# Patient Record
Sex: Male | Born: 1980 | Race: Black or African American | Hispanic: No | Marital: Single | Smoking: Never smoker
Health system: Southern US, Community
[De-identification: ages and names within clinical notes are randomized; demographics above are authoritative.]

## PROBLEM LIST (undated history)

## (undated) DIAGNOSIS — I514 Myocarditis, unspecified: Secondary | ICD-10-CM

## (undated) DIAGNOSIS — I252 Old myocardial infarction: Secondary | ICD-10-CM

## (undated) HISTORY — DX: Old myocardial infarction: I25.2

## (undated) HISTORY — DX: Myocarditis, unspecified: I51.4

---

## 2002-08-01 ENCOUNTER — Emergency Department (HOSPITAL_COMMUNITY): Admission: EM | Admit: 2002-08-01 | Discharge: 2002-08-02 | Payer: Self-pay | Admitting: Emergency Medicine

## 2003-10-25 ENCOUNTER — Emergency Department (HOSPITAL_COMMUNITY): Admission: EM | Admit: 2003-10-25 | Discharge: 2003-10-25 | Payer: Self-pay | Admitting: Emergency Medicine

## 2004-02-14 ENCOUNTER — Ambulatory Visit (HOSPITAL_COMMUNITY): Admission: RE | Admit: 2004-02-14 | Discharge: 2004-02-14 | Payer: Self-pay | Admitting: Family Medicine

## 2006-07-10 ENCOUNTER — Emergency Department (HOSPITAL_COMMUNITY): Admission: EM | Admit: 2006-07-10 | Discharge: 2006-07-10 | Payer: Self-pay | Admitting: Emergency Medicine

## 2007-09-02 ENCOUNTER — Ambulatory Visit (HOSPITAL_COMMUNITY): Admission: RE | Admit: 2007-09-02 | Discharge: 2007-09-02 | Payer: Self-pay | Admitting: Family Medicine

## 2007-09-02 ENCOUNTER — Encounter: Payer: Self-pay | Admitting: Orthopedic Surgery

## 2007-09-03 ENCOUNTER — Ambulatory Visit: Payer: Self-pay | Admitting: Orthopedic Surgery

## 2007-09-03 ENCOUNTER — Encounter (INDEPENDENT_AMBULATORY_CARE_PROVIDER_SITE_OTHER): Payer: Self-pay | Admitting: *Deleted

## 2007-09-03 DIAGNOSIS — S4360XA Sprain of unspecified sternoclavicular joint, initial encounter: Secondary | ICD-10-CM | POA: Insufficient documentation

## 2007-10-13 ENCOUNTER — Telehealth: Payer: Self-pay | Admitting: Orthopedic Surgery

## 2008-05-28 ENCOUNTER — Emergency Department (HOSPITAL_COMMUNITY): Admission: EM | Admit: 2008-05-28 | Discharge: 2008-05-29 | Payer: Self-pay | Admitting: Emergency Medicine

## 2010-04-09 ENCOUNTER — Emergency Department (HOSPITAL_COMMUNITY): Admission: EM | Admit: 2010-04-09 | Discharge: 2010-04-09 | Payer: Self-pay | Admitting: Emergency Medicine

## 2012-09-21 ENCOUNTER — Encounter (HOSPITAL_COMMUNITY): Payer: Self-pay | Admitting: *Deleted

## 2012-09-21 ENCOUNTER — Emergency Department (HOSPITAL_COMMUNITY)
Admission: EM | Admit: 2012-09-21 | Discharge: 2012-09-22 | Disposition: A | Discharge status: Home / Self Care | Payer: MEDICAID | Attending: Emergency Medicine | Admitting: Emergency Medicine

## 2012-09-21 DIAGNOSIS — R509 Fever, unspecified: Secondary | ICD-10-CM | POA: Insufficient documentation

## 2012-09-21 DIAGNOSIS — R51 Headache: Secondary | ICD-10-CM

## 2012-09-21 DIAGNOSIS — M25569 Pain in unspecified knee: Secondary | ICD-10-CM | POA: Insufficient documentation

## 2012-09-21 DIAGNOSIS — R21 Rash and other nonspecific skin eruption: Secondary | ICD-10-CM

## 2012-09-21 LAB — CBC WITH DIFFERENTIAL/PLATELET
Basophils Absolute: 0 10*3/uL (ref 0.0–0.1)
Eosinophils Relative: 0 % (ref 0–5)
HCT: 36.2 % — ABNORMAL LOW (ref 39.0–52.0)
Hemoglobin: 13 g/dL (ref 13.0–17.0)
Lymphocytes Relative: 17 % (ref 12–46)
MCV: 84.8 fL (ref 78.0–100.0)
Monocytes Relative: 8 % (ref 3–12)
Neutrophils Relative %: 75 % (ref 43–77)
Platelets: 152 10*3/uL (ref 150–400)
WBC: 9.4 10*3/uL (ref 4.0–10.5)

## 2012-09-21 MED ORDER — DEXAMETHASONE SODIUM PHOSPHATE 10 MG/ML IJ SOLN
10.0000 mg | Freq: Once | INTRAMUSCULAR | Status: AC
  Administered 2012-09-22: 10 mg via INTRAVENOUS
  Filled 2012-09-21: qty 1

## 2012-09-21 MED ORDER — METOCLOPRAMIDE HCL 5 MG/ML IJ SOLN
10.0000 mg | Freq: Once | INTRAMUSCULAR | Status: AC
  Administered 2012-09-22: 10 mg via INTRAVENOUS
  Filled 2012-09-21: qty 2

## 2012-09-21 MED ORDER — SODIUM CHLORIDE 0.9 % IV BOLUS (SEPSIS)
1000.0000 mL | Freq: Once | INTRAVENOUS | Status: AC
  Administered 2012-09-22: 1000 mL via INTRAVENOUS

## 2012-09-21 MED ORDER — DIPHENHYDRAMINE HCL 50 MG/ML IJ SOLN
25.0000 mg | Freq: Once | INTRAMUSCULAR | Status: AC
  Administered 2012-09-22: 25 mg via INTRAVENOUS
  Filled 2012-09-21: qty 1

## 2012-09-21 NOTE — ED Notes (Signed)
Pt c/o fever since Wednesday, headaches, and body aches.

## 2012-09-21 NOTE — ED Provider Notes (Signed)
History     CSN: [id number]  Arrival date & time 09/21/12  2148   First MD Initiated Contact with Patient 09/21/12 2214      Chief Complaint  Patient presents with  . Muscle Pain    (Consider location/radiation/quality/duration/timing/severity/associated sxs/prior treatment) HPI History provided by pt.   Pt has had fever and intermittent frontal headache for the past 6 days.  Max temp 102.6.  Associated w/ bilateral upper arm as well as bilateral posterior knee pain that is alleviated only by elevation and walking; unable to stand to urinate.  No associated sore throat, cough, N/V/D.  Has had decreased urgency to urinate, but otherwise no urinary sx.  No known sick contacts.  Evaluated by PCP this morning and she noticed a rash to lower legs.  Pt denies pain/pruritis.  She obtained a CBC and strep which was negative, and referred to ER for further evaluation.  Pt is not aware of any tick bites.   History reviewed. No pertinent past medical history.  History reviewed. No pertinent past surgical history.  History reviewed. No pertinent family history.  History  Substance Use Topics  . Smoking status: Never Smoker   . Smokeless tobacco: Not on file  . Alcohol Use: No      Review of Systems  All other systems reviewed and are negative.    Allergies  Review of patient's allergies indicates no known allergies.  Home Medications   Current Outpatient Rx  Name  Route  Sig  Dispense  Refill  . ACETAMINOPHEN 500 MG PO TABS   Oral   Take 1,000 mg by mouth every 6 (six) hours as needed. For pain.         . IBUPROFEN 200 MG PO TABS   Oral   Take 800 mg by mouth every 6 (six) hours as needed. For fever and pain.           BP 122/66  Pulse 90  Temp 99.9 F (37.7 C)  Resp 16  SpO2 98%  Physical Exam  Nursing note and vitals reviewed. Constitutional: He is oriented to person, place, and time. He appears well-developed and well-nourished. No distress.  HENT:  Head:  Normocephalic and atraumatic.       Erythema of soft palate.  No tonsillar edema or exudate  Eyes:       Normal appearance  Neck: Normal range of motion.       No meningeal signs  Cardiovascular: Normal rate, regular rhythm and intact distal pulses.   Pulmonary/Chest: Effort normal and breath sounds normal. No respiratory distress.  Abdominal: Soft. Bowel sounds are normal. He exhibits no distension. There is no tenderness.  Musculoskeletal: Normal range of motion.  Lymphadenopathy:    He has no cervical adenopathy.  Neurological: He is alert and oriented to person, place, and time. No sensory deficit. Coordination normal.       CN 3-12 intact.  No nystagmus. 5/5 and equal upper and lower extremity strength.  No past pointing.     Skin: Skin is warm and dry. No rash noted.       Blanching, macular rash bilateral anterior lower legs from knee to ankle.  Non-tender.  No rash of palms/soles.   Psychiatric: He has a normal mood and affect. His behavior is normal.    ED Course  Procedures (including critical care time)  Labs Reviewed - No data to display No results found.   1. Headache   2. Rash  MDM  31yo M presents w/ 5 days headache, fever, myalgias, rash.  On exam, pt well-appearing, afebrile, no focal neuro deficits or meningeal signs, erythema of soft palate, no tonsillar edema/exudate or cervical adenopathy, nml breath sounds, abd benign, macular rash LEs sparing soles.  Meningitis unlikely based on exam; Dr. Rubin Payor has examined and is in agreement.  UA, CBC, CMP, sed rate and RMSF pending.  11:07 PM     Pt received IV NS, reglan, decadron and benadryl and reports that his headache has resolved.  Labs unremarkable w/ exception of RMSF which is pending,.  Results discussed w/ pt.  D/c'd home.  Recommended rest, fluids and f/u with his PCP.  Return precautions discussed. 1:06 AM         Otilio Miu, PA 09/22/12 [phone]

## 2012-09-21 NOTE — ED Notes (Signed)
Sent by [facility], concern for tick bite?

## 2012-09-22 LAB — URINALYSIS, ROUTINE W REFLEX MICROSCOPIC
Ketones, ur: 40 mg/dL — AB
pH: 6.5 (ref 5.0–8.0)

## 2012-09-22 LAB — COMPREHENSIVE METABOLIC PANEL
Alkaline Phosphatase: 86 U/L (ref 39–117)
BUN: 6 mg/dL (ref 6–23)
CO2: 29 mEq/L (ref 19–32)
Calcium: 8.8 mg/dL (ref 8.4–10.5)
Chloride: 99 mEq/L (ref 96–112)
Creatinine, Ser: 1.06 mg/dL (ref 0.50–1.35)
Glucose, Bld: 121 mg/dL — ABNORMAL HIGH (ref 70–99)
Potassium: 3.9 mEq/L (ref 3.5–5.1)
Total Protein: 7.2 g/dL (ref 6.0–8.3)

## 2012-09-22 MED ORDER — HYDROCODONE-ACETAMINOPHEN 5-325 MG PO TABS: 1.0000 | ORAL_TABLET | ORAL | Status: DC | PRN

## 2012-09-22 MED ORDER — IBUPROFEN 800 MG PO TABS: 800.0000 mg | ORAL_TABLET | Freq: Three times a day (TID) | ORAL | Status: DC

## 2012-09-23 NOTE — ED Provider Notes (Signed)
Medical screening examination/treatment/procedure(s) were performed by non-physician practitioner and as supervising physician I was immediately available for consultation/collaboration.  Dezirae Service R. Blakeleigh Domek, MD 09/23/12 0356 

## 2012-09-25 LAB — ROCKY MTN SPOTTED FVR AB, IGG-BLOOD: RMSF IgG: 0.27 IV

## 2021-07-05 ENCOUNTER — Inpatient Hospital Stay (HOSPITAL_BASED_OUTPATIENT_CLINIC_OR_DEPARTMENT_OTHER)
Admission: EM | Admit: 2021-07-05 | Discharge: 2021-07-08 | DRG: 281 | Disposition: A | Discharge status: Home / Self Care | Payer: 59 | Attending: Internal Medicine | Admitting: Internal Medicine

## 2021-07-05 ENCOUNTER — Encounter (HOSPITAL_BASED_OUTPATIENT_CLINIC_OR_DEPARTMENT_OTHER): Payer: Self-pay | Admitting: Emergency Medicine

## 2021-07-05 ENCOUNTER — Emergency Department (HOSPITAL_BASED_OUTPATIENT_CLINIC_OR_DEPARTMENT_OTHER): Payer: 59 | Admitting: Radiology

## 2021-07-05 ENCOUNTER — Other Ambulatory Visit: Payer: Self-pay

## 2021-07-05 DIAGNOSIS — Z20822 Contact with and (suspected) exposure to covid-19: Secondary | ICD-10-CM | POA: Diagnosis present

## 2021-07-05 DIAGNOSIS — R21 Rash and other nonspecific skin eruption: Secondary | ICD-10-CM | POA: Diagnosis not present

## 2021-07-05 DIAGNOSIS — I400 Infective myocarditis: Principal | ICD-10-CM | POA: Diagnosis present

## 2021-07-05 DIAGNOSIS — R7401 Elevation of levels of liver transaminase levels: Secondary | ICD-10-CM

## 2021-07-05 DIAGNOSIS — R509 Fever, unspecified: Secondary | ICD-10-CM

## 2021-07-05 DIAGNOSIS — B09 Unspecified viral infection characterized by skin and mucous membrane lesions: Secondary | ICD-10-CM

## 2021-07-05 DIAGNOSIS — B3322 Viral myocarditis: Secondary | ICD-10-CM

## 2021-07-05 DIAGNOSIS — M60004 Infective myositis, unspecified left leg: Secondary | ICD-10-CM | POA: Diagnosis present

## 2021-07-05 DIAGNOSIS — I214 Non-ST elevation (NSTEMI) myocardial infarction: Secondary | ICD-10-CM | POA: Diagnosis present

## 2021-07-05 DIAGNOSIS — R519 Headache, unspecified: Secondary | ICD-10-CM | POA: Diagnosis present

## 2021-07-05 DIAGNOSIS — M60005 Infective myositis, unspecified leg: Secondary | ICD-10-CM

## 2021-07-05 DIAGNOSIS — I409 Acute myocarditis, unspecified: Secondary | ICD-10-CM | POA: Diagnosis present

## 2021-07-05 LAB — TROPONIN I (HIGH SENSITIVITY): Troponin I (High Sensitivity): <2 ng/L (ref <18)

## 2021-07-05 LAB — BASIC METABOLIC PANEL
Anion gap: 10 (ref 5–15)
BUN: 8 mg/dL (ref 6–20)
CO2: 28 mmol/L (ref 22–32)
Calcium: 8.9 mg/dL (ref 8.9–10.3)
Chloride: 97 mmol/L — ABNORMAL LOW (ref 98–111)
Creatinine, Ser: 1 mg/dL (ref 0.61–1.24)
GFR, Estimated: >60 mL/min (ref >60)
Glucose, Bld: 118 mg/dL — ABNORMAL HIGH (ref 70–99)
Potassium: 3.7 mmol/L (ref 3.5–5.1)
Sodium: 135 mmol/L (ref 135–145)

## 2021-07-05 LAB — CBC
HCT: 39.2 % (ref 39.0–52.0)
Hemoglobin: 13.6 g/dL (ref 13.0–17.0)
MCH: 30.7 pg (ref 26.0–34.0)
MCHC: 34.7 g/dL (ref 30.0–36.0)
MCV: 88.5 fL (ref 80.0–100.0)
Platelets: 217 10*3/uL (ref 150–400)
RBC: 4.43 MIL/uL (ref 4.22–5.81)
RDW: 13.3 % (ref 11.5–15.5)
WBC: 12.1 10*3/uL — ABNORMAL HIGH (ref 4.0–10.5)
nRBC: 0 % (ref 0.0–0.2)

## 2021-07-05 MED ORDER — IBUPROFEN 400 MG PO TABS
600.0000 mg | ORAL_TABLET | Freq: Once | ORAL | Status: AC
  Administered 2021-07-05: 600 mg via ORAL
  Filled 2021-07-05: qty 1

## 2021-07-05 MED ORDER — DOXYCYCLINE HYCLATE 100 MG PO CAPS: 100.0000 mg | ORAL_CAPSULE | Freq: Two times a day (BID) | ORAL | 0 refills | Status: DC

## 2021-07-05 NOTE — ED Triage Notes (Addendum)
Pt arrives pov with c/o fever, generalized body aches and rash, red circular on body and bilaterally on arms and legs x 5 days. 400 mg advil 3 hrs pta. Pt also c/o chest tightness

## 2021-07-05 NOTE — ED Provider Notes (Addendum)
[facility] EMERGENCY DEPT Provider Note   CSN: [id number] Arrival date & time: 07/05/21  1837     History Chief Complaint  Patient presents with   Fever    Juan Bowers is a 40 y.o. male.  Patient is a 40 year old male with no significant past medical history.  Patient presenting with complaints of fever and rash.  He reports a 5-day history of body aches, cold chills, sweating episodes.  He has been running a fever at home to 101.  He was seen at urgent care yesterday and had a COVID test which was negative.  He has noticed a rash to his torso, arms, and legs.  He has seen small, red, raised areas in these areas.  The history is provided by the patient.  Fever Severity:  Moderate Onset quality:  Sudden Duration:  5 days Timing:  Intermittent Progression:  Worsening Chronicity:  New Relieved by:  Nothing Worsened by:  Nothing Ineffective treatments:  None tried     History reviewed. No pertinent past medical history.  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 No pertinent surgical history.     History reviewed. No pertinent family history.  Social History   Tobacco Use   Smoking status: Never  Substance Use Topics   Alcohol use: Not Currently   Drug use: No    Home Medications Prior to Admission medications   Medication Sig Start Date End Date Taking? Authorizing Provider  acetaminophen (TYLENOL) 500 MG tablet Take 1,000 mg by mouth every 6 (six) hours as needed. For pain.    [provider]  HYDROcodone-acetaminophen (NORCO/VICODIN) 5-325 MG per tablet Take 1 tablet by mouth every 4 (four) hours as needed for pain. 09/22/12   Schinlever, Santina Evans, PA-C  ibuprofen (ADVIL,MOTRIN) 200 MG tablet Take 800 mg by mouth every 6 (six) hours as needed. For fever and pain.    [provider]  ibuprofen (ADVIL,MOTRIN) 800 MG tablet Take 1 tablet (800 mg  total) by mouth 3 (three) times daily. 09/22/12   Schinlever, Santina Evans, PA-C    Allergies    Patient has no known allergies.  Review of Systems   Review of Systems  Constitutional:  Positive for fever.  All other systems reviewed and are negative.  Physical Exam Updated Vital Signs BP 125/82 (BP Location: Right Arm)   Pulse 86   Temp 99.9 F (37.7 C) (Oral)   Resp 20   Ht 6\' 1"  (1.854 m)   Wt 91.2 kg   SpO2 100%   BMI 26.52 kg/m   Physical Exam Vitals and nursing note reviewed.  Constitutional:      General: He is not in acute distress.    Appearance: He is well-developed. He is not diaphoretic.  HENT:     Head: Normocephalic and atraumatic.  Cardiovascular:     Rate and Rhythm: Normal rate and regular rhythm.     Heart sounds: No murmur heard.   No friction rub.  Pulmonary:     Effort: Pulmonary effort is normal. No respiratory distress.     Breath sounds: Normal breath sounds. No wheezing or rales.  Abdominal:     General: Bowel sounds are normal. There is no distension.     Palpations: Abdomen is soft.     Tenderness: There is no abdominal tenderness.  Musculoskeletal:        General: Normal range of motion.  Cervical back: Normal range of motion and neck supple.  Skin:    General: Skin is warm and dry.     Findings: Rash present.     Comments: There are multiple, round, blanching, erythematous lesions noted to the upper and lower extremities and torso.  Neurological:     Mental Status: He is alert and oriented to person, place, and time.     Coordination: Coordination normal.    ED Results / Procedures / Treatments   Labs (all labs ordered are listed, but only abnormal results are displayed) Labs Reviewed  BASIC METABOLIC PANEL - Abnormal; Notable for the following components:      Result Value   Chloride 97 (*)    Glucose, Bld 118 (*)    All other components within normal limits  CBC - Abnormal; Notable for the following components:   WBC 12.1  (*)    All other components within normal limits  TROPONIN I (HIGH SENSITIVITY)  TROPONIN I (HIGH SENSITIVITY)    EKG None  Radiology DG Chest 2 View  Result Date: 07/05/2021 CLINICAL DATA:  Fever, chest tightness EXAM: CHEST - 2 VIEW COMPARISON:  None. FINDINGS: The heart size and mediastinal contours are within normal limits. Both lungs are clear. The visualized skeletal structures are unremarkable. IMPRESSION: No active cardiopulmonary disease. Electronically Signed   By: Charlett Nose M.D.   On: 07/05/2021 20:21    Procedures Procedures   Medications Ordered in ED Medications - No data to display  ED Course  I have reviewed the triage vital signs and the nursing notes.  Pertinent labs & imaging results that were available during my care of the patient were reviewed by me and considered in my medical decision making (see chart for details).    MDM Rules/Calculators/A&P  Patient presenting here with complaints of fever and rash.  I suspect a viral etiology, but patient not improving with conservative care.  He denies a history of tick bite, but I feel as though RMSF is on the differential.  Patient to be started on doxycycline if symptoms are not improving in the next 24 hours.  The rash is not petechial and inconsistent with monkey pox.  Patient initially to be discharged, however when the nurse returned to his room to go over his discharge instructions, patient was complaining of chest pain radiating into his left arm.  Patient was reassessed and EKG was repeated.  He had nonspecific T wave abnormalities in the inferior leads, unchanged from his initial EKG upon presentation.  Patient appeared anxious and was complaining of tingling in his hands, so was given an injection of Ativan.  A second troponin was obtained, unfortunately revealing level of 12,000.  Patient then started on heparin and aspirin and cardiology was consulted.  I spoke with Dr. Patsi Sears who agrees to accept  the patient in transfer.  This is somewhat confusing clinical picture has patient initially presented with an acute febrile illness which may or may not be related to his acute coronary syndrome/non-STEMI which was found at the end of his visit just before he was discharged.  Whether he has some sort of myocarditis or febrile illness with unrelated non-STEMI remains to be seen.  CRITICAL CARE Performed by: Geoffery Lyons Total critical care time: 45 minutes Critical care time was exclusive of separately billable procedures and treating other patients. Critical care was necessary to treat or prevent imminent or life-threatening deterioration. Critical care was time spent personally by me on the following activities:  development of treatment plan with patient and/or surrogate as well as nursing, discussions with consultants, evaluation of patient's response to treatment, examination of patient, obtaining history from patient or surrogate, ordering and performing treatments and interventions, ordering and review of laboratory studies, ordering and review of radiographic studies, pulse oximetry and re-evaluation of patient's condition.   Final Clinical Impression(s) / ED Diagnoses Final diagnoses:  None    Rx / DC Orders ED Discharge Orders     None        Geoffery Lyons, MD 07/05/21 4268    Geoffery Lyons, MD 07/05/21 3419    Geoffery Lyons, MD 07/06/21 6222    Geoffery Lyons, MD 07/06/21 [phone]

## 2021-07-05 NOTE — Discharge Instructions (Addendum)
Take Tylenol 1000 mg rotated with ibuprofen 600 mg every 4 hours as needed for fever.  Drink plenty of fluids and get plenty of rest.  If symptoms are not improving in the next 1 to 2 days, fill the prescription for doxycycline you have been given this evening.

## 2021-07-06 ENCOUNTER — Inpatient Hospital Stay (HOSPITAL_COMMUNITY): Payer: 59

## 2021-07-06 ENCOUNTER — Other Ambulatory Visit: Payer: Self-pay

## 2021-07-06 DIAGNOSIS — R21 Rash and other nonspecific skin eruption: Secondary | ICD-10-CM | POA: Diagnosis present

## 2021-07-06 DIAGNOSIS — I409 Acute myocarditis, unspecified: Secondary | ICD-10-CM | POA: Diagnosis present

## 2021-07-06 DIAGNOSIS — M60004 Infective myositis, unspecified left leg: Secondary | ICD-10-CM | POA: Diagnosis present

## 2021-07-06 DIAGNOSIS — R519 Headache, unspecified: Secondary | ICD-10-CM | POA: Diagnosis present

## 2021-07-06 DIAGNOSIS — Z20822 Contact with and (suspected) exposure to covid-19: Secondary | ICD-10-CM | POA: Diagnosis present

## 2021-07-06 DIAGNOSIS — I214 Non-ST elevation (NSTEMI) myocardial infarction: Secondary | ICD-10-CM | POA: Diagnosis present

## 2021-07-06 DIAGNOSIS — R079 Chest pain, unspecified: Secondary | ICD-10-CM | POA: Diagnosis not present

## 2021-07-06 DIAGNOSIS — I400 Infective myocarditis: Principal | ICD-10-CM

## 2021-07-06 DIAGNOSIS — B09 Unspecified viral infection characterized by skin and mucous membrane lesions: Secondary | ICD-10-CM | POA: Diagnosis present

## 2021-07-06 LAB — BASIC METABOLIC PANEL
Anion gap: 12 (ref 5–15)
BUN: 6 mg/dL (ref 6–20)
CO2: 25 mmol/L (ref 22–32)
Calcium: 9 mg/dL (ref 8.9–10.3)
Chloride: 99 mmol/L (ref 98–111)
Creatinine, Ser: 0.99 mg/dL (ref 0.61–1.24)
GFR, Estimated: >60 mL/min (ref >60)
Glucose, Bld: 118 mg/dL — ABNORMAL HIGH (ref 70–99)
Potassium: 3.6 mmol/L (ref 3.5–5.1)
Sodium: 136 mmol/L (ref 135–145)

## 2021-07-06 LAB — HEPATIC FUNCTION PANEL
ALT: 139 U/L — ABNORMAL HIGH (ref 0–44)
AST: 76 U/L — ABNORMAL HIGH (ref 15–41)
Albumin: 3.4 g/dL — ABNORMAL LOW (ref 3.5–5.0)
Alkaline Phosphatase: 102 U/L (ref 38–126)
Bilirubin, Direct: 0.3 mg/dL — ABNORMAL HIGH (ref 0.0–0.2)
Indirect Bilirubin: 1 mg/dL — ABNORMAL HIGH (ref 0.3–0.9)
Total Bilirubin: 1.3 mg/dL — ABNORMAL HIGH (ref 0.3–1.2)
Total Protein: 7 g/dL (ref 6.5–8.1)

## 2021-07-06 LAB — CBC
HCT: 36.7 % — ABNORMAL LOW (ref 39.0–52.0)
Hemoglobin: 13.1 g/dL (ref 13.0–17.0)
MCH: 31.2 pg (ref 26.0–34.0)
MCHC: 35.7 g/dL (ref 30.0–36.0)
MCV: 87.4 fL (ref 80.0–100.0)
Platelets: 218 10*3/uL (ref 150–400)
RBC: 4.2 MIL/uL — ABNORMAL LOW (ref 4.22–5.81)
RDW: 13.3 % (ref 11.5–15.5)
WBC: 10.8 10*3/uL — ABNORMAL HIGH (ref 4.0–10.5)
nRBC: 0 % (ref 0.0–0.2)

## 2021-07-06 LAB — RESP PANEL BY RT-PCR (FLU A&B, COVID) ARPGX2
Influenza A by PCR: NEGATIVE
Influenza B by PCR: NEGATIVE
SARS Coronavirus 2 by RT PCR: NEGATIVE

## 2021-07-06 LAB — HEPATITIS PANEL, ACUTE
HCV Ab: NONREACTIVE
Hep A IgM: NONREACTIVE
Hep B C IgM: NONREACTIVE
Hepatitis B Surface Ag: NONREACTIVE

## 2021-07-06 LAB — ECHOCARDIOGRAM COMPLETE
AR max vel: 4.35 cm2
AV Area VTI: 4.37 cm2
AV Area mean vel: 3.65 cm2
AV Mean grad: 3 mmHg
AV Peak grad: 3.8 mmHg
Ao pk vel: 0.97 m/s
Area-P 1/2: 4.89 cm2
Height: 73 in
S' Lateral: 3 cm
Single Plane A4C EF: 68.6 %
Weight: 3259.2 oz

## 2021-07-06 LAB — LIPID PANEL
Cholesterol: 170 mg/dL (ref 0–200)
HDL: 34 mg/dL — ABNORMAL LOW (ref >40)
LDL Cholesterol: 120 mg/dL — ABNORMAL HIGH (ref 0–99)
Total CHOL/HDL Ratio: 5 RATIO
Triglycerides: 78 mg/dL (ref <150)
VLDL: 16 mg/dL (ref 0–40)

## 2021-07-06 LAB — HEPARIN LEVEL (UNFRACTIONATED)
Heparin Unfractionated: 0.16 IU/mL — ABNORMAL LOW (ref 0.30–0.70)
Heparin Unfractionated: 0.18 IU/mL — ABNORMAL LOW (ref 0.30–0.70)
Heparin Unfractionated: 0.15 IU/mL — ABNORMAL LOW (ref 0.30–0.70)

## 2021-07-06 LAB — HEMOGLOBIN A1C
Hgb A1c MFr Bld: 5.5 % (ref 4.8–5.6)
Mean Plasma Glucose: 111.15 mg/dL

## 2021-07-06 LAB — TROPONIN I (HIGH SENSITIVITY)
Troponin I (High Sensitivity): 12243 ng/L (ref <18)
Troponin I (High Sensitivity): 16108 ng/L (ref <18)
Troponin I (High Sensitivity): 7473 ng/L (ref <18)
Troponin I (High Sensitivity): 5905 ng/L (ref <18)
Troponin I (High Sensitivity): 5318 ng/L (ref <18)

## 2021-07-06 LAB — HIV ANTIBODY (ROUTINE TESTING W REFLEX): HIV Screen 4th Generation wRfx: NONREACTIVE

## 2021-07-06 LAB — BRAIN NATRIURETIC PEPTIDE: B Natriuretic Peptide: 128.1 pg/mL — ABNORMAL HIGH (ref 0.0–100.0)

## 2021-07-06 LAB — PROTIME-INR
INR: 1.2 (ref 0.8–1.2)
Prothrombin Time: 15.3 seconds — ABNORMAL HIGH (ref 11.4–15.2)

## 2021-07-06 LAB — D-DIMER, QUANTITATIVE: D-Dimer, Quant: 0.68 ug/mL-FEU — ABNORMAL HIGH (ref 0.00–0.50)

## 2021-07-06 LAB — C-REACTIVE PROTEIN: CRP: 13.8 mg/dL — ABNORMAL HIGH (ref <1.0)

## 2021-07-06 LAB — SEDIMENTATION RATE: Sed Rate: 51 mm/hr — ABNORMAL HIGH (ref 0–16)

## 2021-07-06 LAB — MAGNESIUM: Magnesium: 2.3 mg/dL (ref 1.7–2.4)

## 2021-07-06 MED ORDER — HEPARIN BOLUS VIA INFUSION
2500.0000 [IU] | Freq: Once | INTRAVENOUS | Status: AC
  Administered 2021-07-06: 2500 [IU] via INTRAVENOUS
  Filled 2021-07-06: qty 2500

## 2021-07-06 MED ORDER — ASPIRIN 81 MG PO CHEW
324.0000 mg | CHEWABLE_TABLET | Freq: Once | ORAL | Status: AC
  Administered 2021-07-06: 324 mg via ORAL
  Filled 2021-07-06: qty 4

## 2021-07-06 MED ORDER — HEPARIN BOLUS VIA INFUSION
4000.0000 [IU] | Freq: Once | INTRAVENOUS | Status: AC
  Administered 2021-07-06: 4000 [IU] via INTRAVENOUS

## 2021-07-06 MED ORDER — DILTIAZEM HCL 25 MG/5ML IV SOLN
INTRAVENOUS | Status: AC
  Administered 2021-07-06: 10 mg
  Filled 2021-07-06: qty 5

## 2021-07-06 MED ORDER — METOPROLOL TARTRATE 12.5 MG HALF TABLET
12.5000 mg | ORAL_TABLET | Freq: Two times a day (BID) | ORAL | Status: DC
  Administered 2021-07-06 – 2021-07-08 (×4): 12.5 mg via ORAL
  Filled 2021-07-06 (×4): qty 1

## 2021-07-06 MED ORDER — ATORVASTATIN CALCIUM 80 MG PO TABS
80.0000 mg | ORAL_TABLET | Freq: Every day | ORAL | Status: DC
  Administered 2021-07-06: 80 mg via ORAL
  Filled 2021-07-06: qty 1

## 2021-07-06 MED ORDER — ASPIRIN EC 81 MG PO TBEC: 81.0000 mg | DELAYED_RELEASE_TABLET | Freq: Every day | ORAL | Status: DC

## 2021-07-06 MED ORDER — ATORVASTATIN CALCIUM 40 MG PO TABS: 40.0000 mg | ORAL_TABLET | Freq: Every day | ORAL | Status: DC

## 2021-07-06 MED ORDER — NITROGLYCERIN 2 % TD OINT
1.0000 [in_us] | TOPICAL_OINTMENT | Freq: Once | TRANSDERMAL | Status: DC
  Filled 2021-07-06: qty 1

## 2021-07-06 MED ORDER — ACETAMINOPHEN 325 MG PO TABS
650.0000 mg | ORAL_TABLET | ORAL | Status: DC | PRN
  Administered 2021-07-06 – 2021-07-08 (×4): 650 mg via ORAL
  Filled 2021-07-06 (×3): qty 2

## 2021-07-06 MED ORDER — METOPROLOL TARTRATE 50 MG PO TABS
50.0000 mg | ORAL_TABLET | Freq: Once | ORAL | Status: AC
  Administered 2021-07-06: 50 mg via ORAL
  Filled 2021-07-06: qty 1

## 2021-07-06 MED ORDER — HEPARIN (PORCINE) 25000 UT/250ML-% IV SOLN
1700.0000 [IU]/h | INTRAVENOUS | Status: DC
  Administered 2021-07-06: 1450 [IU]/h via INTRAVENOUS
  Administered 2021-07-06: 1100 [IU]/h via INTRAVENOUS
  Filled 2021-07-06 (×3): qty 250

## 2021-07-06 MED ORDER — NITROGLYCERIN 0.4 MG SL SUBL: 0.4000 mg | SUBLINGUAL_TABLET | SUBLINGUAL | Status: DC | PRN

## 2021-07-06 MED ORDER — METOPROLOL TARTRATE 25 MG PO TABS
25.0000 mg | ORAL_TABLET | Freq: Two times a day (BID) | ORAL | Status: DC
  Administered 2021-07-06: 25 mg via ORAL
  Filled 2021-07-06: qty 1

## 2021-07-06 MED ORDER — LORAZEPAM 2 MG/ML IJ SOLN
2.0000 mg | Freq: Once | INTRAMUSCULAR | Status: AC
  Administered 2021-07-06: 2 mg via INTRAMUSCULAR
  Filled 2021-07-06: qty 1

## 2021-07-06 MED ORDER — IOHEXOL 350 MG/ML SOLN
95.0000 mL | Freq: Once | INTRAVENOUS | Status: AC | PRN
  Administered 2021-07-06: 95 mL via INTRAVENOUS

## 2021-07-06 MED ORDER — COLCHICINE 0.6 MG PO TABS
0.6000 mg | ORAL_TABLET | Freq: Two times a day (BID) | ORAL | Status: DC
  Administered 2021-07-06 – 2021-07-08 (×5): 0.6 mg via ORAL
  Filled 2021-07-06 (×5): qty 1

## 2021-07-06 MED ORDER — ONDANSETRON HCL 4 MG/2ML IJ SOLN: 4.0000 mg | Freq: Four times a day (QID) | INTRAMUSCULAR | Status: DC | PRN

## 2021-07-06 MED ORDER — SODIUM CHLORIDE 0.9 % IV SOLN
INTRAVENOUS | Status: DC | PRN
  Administered 2021-07-06: 1000 mL via INTRAVENOUS

## 2021-07-06 MED ORDER — NITROGLYCERIN 0.4 MG SL SUBL
SUBLINGUAL_TABLET | SUBLINGUAL | Status: AC
  Administered 2021-07-06: 0.8 mg
  Filled 2021-07-06: qty 2

## 2021-07-06 MED ORDER — HEPARIN BOLUS VIA INFUSION
2800.0000 [IU] | Freq: Once | INTRAVENOUS | Status: AC
  Administered 2021-07-06: 2800 [IU] via INTRAVENOUS
  Filled 2021-07-06: qty 2800

## 2021-07-06 MED ORDER — METOPROLOL TARTRATE 5 MG/5ML IV SOLN
INTRAVENOUS | Status: AC
  Administered 2021-07-06: 10 mg
  Filled 2021-07-06: qty 10

## 2021-07-06 NOTE — Progress Notes (Addendum)
Progress Note  Patient Name: Juan Bowers Date of Encounter: 07/06/2021  [facility] Cardiologist: New  Subjective   Chest pain has improved.  Patient denies dyspnea.  He has a mild headache.  He denies recent tick exposure.  Inpatient Medications    Scheduled Meds:  [START ON 07/07/2021] aspirin EC  81 mg Oral Daily   atorvastatin  80 mg Oral Daily   metoprolol tartrate  25 mg Oral BID   Continuous Infusions:  heparin 1,450 Units/hr (07/06/21 0934)   PRN Meds: acetaminophen, nitroGLYCERIN, ondansetron (ZOFRAN) IV   Vital Signs    Vitals:   07/06/21 0330 07/06/21 0400 07/06/21 0441 07/06/21 0750  BP: 115/85 111/81 103/76 110/80  Pulse: 74 82 75 72  Resp: (!) 24 (!) 23 20 17   Temp:   97.9 F (36.6 C) 98.3 F (36.8 C)  TempSrc:   Oral Oral  SpO2: 97% 97% 98% 100%  Weight:   92.4 kg   Height:   6\' 1"  (1.854 m)     Intake/Output Summary (Last 24 hours) at 07/06/2021 1038 Last data filed at 07/06/2021 0936 Gross per 24 hour  Intake 71.26 ml  Output 775 ml  Net -703.74 ml   Last 3 Weights 07/06/2021 07/05/2021 09/03/2007  Weight (lbs) 203 lb 11.2 oz 201 lb 185 lb  Weight (kg) 92.398 kg 91.173 kg 83.915 kg      Telemetry    Sinus- Personally Reviewed  ECG    Sinus rhythm with inferolateral T wave inversion  Physical Exam  Biomarkers might GEN: No acute distress.   Neck: No JVD Cardiac: RRR, no rub noted Respiratory: Clear to auscultation bilaterally. GI: Soft, nontender, non-distended  MS: No edema Neuro:  Nonfocal  Psych: Normal affect  Skin: Maculopapular rash  Labs    High Sensitivity Troponin:   Recent Labs  Lab 07/05/21 2157 07/06/21 0100 07/06/21 0258 07/06/21 0756  TROPONINIHS <2 12,243* 16,108* 7,473*      Chemistry Recent Labs  Lab 07/05/21 2157 07/06/21 0530  NA 135 136  K 3.7 3.6  CL 97* 99  CO2 28 25  GLUCOSE 118* 118*  BUN 8 6  CREATININE 1.00 0.99  CALCIUM 8.9 9.0  PROT  --  7.0  ALBUMIN  --  3.4*  AST  --  76*   ALT  --  139*  ALKPHOS  --  102  BILITOT  --  1.3*  GFRNONAA >60 >60  ANIONGAP 10 12     Hematology Recent Labs  Lab 07/05/21 2157 07/06/21 0530  WBC 12.1* 10.8*  RBC 4.43 4.20*  HGB 13.6 13.1  HCT 39.2 36.7*  MCV 88.5 87.4  MCH 30.7 31.2  MCHC 34.7 35.7  RDW 13.3 13.3  PLT 217 218    BNP Recent Labs  Lab 07/06/21 0530  BNP 128.1*     DDimer  Recent Labs  Lab 07/06/21 0756  DDIMER 0.68*     Radiology    DG Chest 2 View  Result Date: 07/05/2021 CLINICAL DATA:  Fever, chest tightness EXAM: CHEST - 2 VIEW COMPARISON:  None. FINDINGS: The heart size and mediastinal contours are within normal limits. Both lungs are clear. The visualized skeletal structures are unremarkable. IMPRESSION: No active cardiopulmonary disease. Electronically Signed   By: 09/05/21 M.D.   On: 07/05/2021 20:21      Patient Profile     Juan Bowers with recent febrile illness (fevers, chills, general muscle aches) presents with chest pain.  Assessment &  Plan    1 probable myocarditis-patient presents with acute febrile illness followed by chest pain.  His troponin is significantly elevated but I think likely from myocarditis (febrile illness, elevated sed rate, rash).  Preliminary review of echocardiogram shows normal LV function and no pericardial effusion.  We will arrange cardiac CTA to exclude coronary disease though I think this is much less likely.  Continue on telemetry to rule out acute arrhythmia.  He will need an outpatient cardiac MRI.  Will add colchicine 0.6 mg twice daily.  We will also treat with low-dose beta-blocker; add ARB later if BP allows.  2 febrile illness-etiology unclear.  He had fevers, myalgias and rash.  He denies any recent exposure to ticks.  I will ask infectious disease to evaluate.  Note COVID is negative as is influenza panel.  3 elevated liver functions-likely secondary to ongoing febrile illness.  Will need to be followed.  For questions or updates,  please contact [facility] Please consult [email or web address] for contact info under        Signed, Olga Millers, MD  07/06/2021, 10:Juan AM

## 2021-07-06 NOTE — Progress Notes (Signed)
ANTICOAGULATION CONSULT NOTE  Pharmacy Consult for Heparin Indication: chest pain/ACS  No Known Allergies  Patient Measurements: Height: 6\' 1"  (185.4 cm) Weight: 92.4 kg (203 lb 11.2 oz) IBW/kg (Calculated) : 79.9 Heparin dosing weight = 92 kg  Vital Signs: Temp: 98.3 F (36.8 C) (09/09 0750) Temp Source: Oral (09/09 0750) BP: 110/80 (09/09 0750) Pulse Rate: 72 (09/09 0750)  Labs: Recent Labs    07/05/21 2157 07/06/21 0100 07/06/21 0258 07/06/21 0530 07/06/21 0756  HGB 13.6  --   --  13.1  --   HCT 39.2  --   --  36.7*  --   PLT 217  --   --  218  --   LABPROT  --   --   --  15.3*  --   INR  --   --   --  1.2  --   HEPARINUNFRC  --   --   --   --  0.16*  CREATININE 1.00  --   --  0.99  --   TROPONINIHS <2 12,243* 16,108*  --   --      Estimated Creatinine Clearance: 115.5 mL/min (by C-G formula based on SCr of 0.99 mg/dL).   Assessment: 40 y.o. male with chest pain to continue on IV heparin for NSTEMI.  Troponin trended up to 16K.  Heparin level is sub-therapeutic at 0.16 units/mL.  No complications with heparin infusion per RN.  Goal of Therapy:  Heparin level 0.3-0.7 units/ml Monitor platelets by anticoagulation protocol: Yes   Plan:  Heparin 2500 units IV bolus, then Increase heparin gtt to 1450 units/hr Check 6 hr heparin level  Daily heparin level and CBC F/u post cath  Emelia Sandoval D. [birth year]-04-1982, PharmD, BCPS, BCCCP 07/06/2021, 9:07 AM

## 2021-07-06 NOTE — Consult Note (Signed)
[facility]    Date of Admission:  07/05/2021   Total days of antibiotics 0               Reason for Consult: Acute myocarditis    Referring Provider: Dr. Martin[place]   Assessment: Patient presented with exertional chest pain after 6-day of viral prodrome including fever, muscle aches and rash.  Troponin was significantly elevated and peaked at 12,000.  EKG showed T wave inversion in the inferior and lateral leads.  His presentation fits the criteria for acute myocarditis.  He has leukocytosis with elevated ESR and CRP.  This is most likely viral myocarditis given the preceding viral prodrome.  Will check HIV, CMV and hepatitis panel.  Will also check RPR to rule out syphilis.  Low suspicion for pericarditis with EKG.  Low suspicion for autoimmune with his viral prodrome presentation and absence of family history.  Doubt sarcoidosis or celiac disease.  Doubt tickborne disease.   Continue supportive care.  Patient has improved significantly.  Good prognosis.  Plan: Check HIV, CMV, hepatitis panel, RPR Continue supportive care.  No indication for steroids.   Pending echocardiogram and CT coronary.  Can consider cardiac MRI to confirm diagnosis. May not need colchicine with low suspicion for pericarditis  Active Problems:   NSTEMI (non-ST elevated myocardial infarction) (HCC)   Scheduled Meds:  [START ON 07/07/2021] aspirin EC  81 mg Oral Daily   atorvastatin  80 mg Oral Daily   colchicine  0.6 mg Oral BID   metoprolol tartrate  12.5 mg Oral BID   Continuous Infusions:  sodium chloride 1,000 mL (07/06/21 1137)   heparin 1,450 Units/hr (07/06/21 0934)   PRN Meds:.sodium chloride, acetaminophen, nitroGLYCERIN, ondansetron (ZOFRAN) IV  HPI: Juan Bowers is a 40 y.o. male with no known past medical history who presents to the hospital for exertional chest pain after 6-day of viral prodrome including muscle ache, fever and rash.  Troponin peaked at  12,000 and trending down.  Patient is seen at bedside with his wife and mother.  He appears comfortable in no acute distress.  He was able to ambulate from the bathroom to the bed without assistance.  He report feeling better.  Still mild headache and chest pain.  Report bilateral lower extremity tingling with standing still and better with walking.  Denies history of recent traveling, tick bite, animal or insect bite.  Denies recent vaccine or sick contacts.  Denies substance use including marijuana and cocaine.  No family history of autoimmune or blood clots.  Nobody in the family has similar symptoms.  Denies history of GI symptoms including bloating and diarrhea.   Review of Systems: ROS Per HPI  History reviewed. No pertinent past medical history.  Social History   Tobacco Use   Smoking status: Never  Substance Use Topics   Alcohol use: Not Currently   Drug use: No    History reviewed. No pertinent family history. No Known Allergies  OBJECTIVE: Blood pressure 112/79, pulse 85, temperature 99.1 F (37.3 C), temperature source Oral, resp. rate 18, height 6' 1"  (1.854 m), weight 92.4 kg, SpO2 100 %.  Physical Exam Constitutional:      General: He is not in acute distress.    Appearance: He is not toxic-appearing.  HENT:     Head: Normocephalic.  Eyes:     General:        Right eye: No discharge.  Left eye: No discharge.     Conjunctiva/sclera: Conjunctivae normal.  Cardiovascular:     Rate and Rhythm: Normal rate and regular rhythm.  Pulmonary:     Effort: Pulmonary effort is normal. No respiratory distress.     Breath sounds: Normal breath sounds.  Abdominal:     General: Bowel sounds are normal. There is no distension.     Palpations: Abdomen is soft.     Tenderness: There is no abdominal tenderness. There is no right CVA tenderness, left CVA tenderness or guarding.     Comments: Negative Murphy sign  Musculoskeletal:        General: No swelling. Normal  range of motion.     Cervical back: Normal range of motion.     Comments: Bilateral lower extremity nontender to palpation.  No erythema or edema.  +2 pedis pulses palpated bilaterally.    Skin:    General: Skin is warm.     Coloration: Skin is not jaundiced.     Comments: Scattered small papules seen on bilateral lower extremity, back and flanks.  No vesicles or purulent discharge.  Nontender to palpation.  No rash on palms and soles of feet.  No track marks noted.  Neurological:     Mental Status: He is alert and oriented to person, place, and time.  Psychiatric:        Mood and Affect: Mood normal.        Behavior: Behavior normal.    Lab Results Lab Results  Component Value Date   WBC 10.8 (H) 07/06/2021   HGB 13.1 07/06/2021   HCT 36.7 (L) 07/06/2021   MCV 87.4 07/06/2021   PLT 218 07/06/2021    Lab Results  Component Value Date   CREATININE 0.99 07/06/2021   BUN 6 07/06/2021   NA 136 07/06/2021   K 3.6 07/06/2021   CL 99 07/06/2021   CO2 25 07/06/2021    Lab Results  Component Value Date   ALT 139 (H) 07/06/2021   AST 76 (H) 07/06/2021   ALKPHOS 102 07/06/2021   BILITOT 1.3 (H) 07/06/2021     Microbiology: Recent Results (from the past 240 hour(s))  Resp Panel by RT-PCR (Flu A&B, Covid) Nasopharyngeal Swab     Status: None   Collection Time: 07/06/21  3:16 AM   Specimen: Nasopharyngeal Swab; Nasopharyngeal(NP) swabs in vial transport medium  Result Value Ref Range Status   SARS Coronavirus 2 by RT PCR NEGATIVE NEGATIVE Final    Comment: (NOTE) SARS-CoV-2 target nucleic acids are NOT DETECTED.  The SARS-CoV-2 RNA is generally detectable in upper respiratory specimens during the acute phase of infection. The lowest concentration of SARS-CoV-2 viral copies this assay can detect is 138 copies/mL. A negative result does not preclude SARS-Cov-2 infection and should not be used as the sole basis for treatment or other patient management decisions. A negative  result may occur with  improper specimen collection/handling, submission of specimen other than nasopharyngeal swab, presence of viral mutation(s) within the areas targeted by this assay, and inadequate number of viral copies(<138 copies/mL). A negative result must be combined with clinical observations, patient history, and epidemiological information. The expected result is Negative.  Fact Sheet for Patients:  EntrepreneurPulse.com.au  Fact Sheet for Healthcare Providers:  IncredibleEmployment.be  This test is no t yet approved or cleared by the [place] FDA and  has been authorized for detection and/or diagnosis of SARS-CoV-2 by FDA under an Emergency Use Authorization (EUA). This EUA will remain  in effect (meaning this test can be used) for the duration of the COVID-19 declaration under Section 564(b)(1) of the Act, 21 U.S.C.section 360bbb-3(b)(1), unless the authorization is terminated  or revoked sooner.       Influenza A by PCR NEGATIVE NEGATIVE Final   Influenza B by PCR NEGATIVE NEGATIVE Final    Comment: (NOTE) The Xpert Xpress SARS-CoV-2/FLU/RSV plus assay is intended as an aid in the diagnosis of influenza from Nasopharyngeal swab specimens and should not be used as a sole basis for treatment. Nasal washings and aspirates are unacceptable for Xpert Xpress SARS-CoV-2/FLU/RSV testing.  Fact Sheet for Patients: EntrepreneurPulse.com.au  Fact Sheet for Healthcare Providers: IncredibleEmployment.be  This test is not yet approved or cleared by the [place] FDA and has been authorized for detection and/or diagnosis of SARS-CoV-2 by FDA under an Emergency Use Authorization (EUA). This EUA will remain in effect (meaning this test can be used) for the duration of the COVID-19 declaration under Section 564(b)(1) of the Act, 21 U.S.C. section 360bbb-3(b)(1), unless the authorization is  terminated or revoked.  Performed at [facility], [address], [place], [place] [address]     Gaylan Gerold, [facility] [phone] pager    07/06/2021, 11:51 AM

## 2021-07-06 NOTE — ED Notes (Signed)
Called [facility] to transport patient to [facility] 3E room#2 

## 2021-07-06 NOTE — Progress Notes (Signed)
Date and time results received: 07/06/21 9:23 AM    Test: Troponin Critical Value: 7,473  Name of Provider Notified: Dion Body NP

## 2021-07-06 NOTE — Progress Notes (Signed)
ANTICOAGULATION CONSULT NOTE  Pharmacy Consult for Heparin Indication: chest pain/ACS  No Known Allergies  Patient Measurements: Height: 6\' 1"  (185.4 cm) Weight: 92.4 kg (203 lb 11.2 oz) IBW/kg (Calculated) : 79.9 Heparin dosing weight = 92 kg  Vital Signs: Temp: 98.9 F (37.2 C) (09/09 1519) Temp Source: Oral (09/09 1519) BP: 107/67 (09/09 1519) Pulse Rate: 86 (09/09 1519)  Labs: Recent Labs    07/05/21 2157 07/06/21 0100 07/06/21 0258 07/06/21 0530 07/06/21 0756 07/06/21 1347 07/06/21 1800  HGB 13.6  --   --  13.1  --   --   --   HCT 39.2  --   --  36.7*  --   --   --   PLT 217  --   --  218  --   --   --   LABPROT  --   --   --  15.3*  --   --   --   INR  --   --   --  1.2  --   --   --   HEPARINUNFRC  --   --   --   --  0.16* 0.18* 0.15*  CREATININE 1.00  --   --  0.99  --   --   --   TROPONINIHS <2   < > 16,108*  --  7,473* 5,905*  --    < > = values in this interval not displayed.     Estimated Creatinine Clearance: 115.5 mL/min (by C-G formula based on SCr of 0.99 mg/dL).   Assessment: 40 y.o. male with chest pain to continue on IV heparin for NSTEMI.  Troponin trended up to 16K.  Heparin level is sub-therapeutic at 0.16 units/mL.  No complications with heparin infusion per RN.  Heparin level returned sub-therapeutic at 0.15.   Goal of Therapy:  Heparin level 0.3-0.7 units/ml Monitor platelets by anticoagulation protocol: Yes   Plan:  Heparin 2800 units IV bolus, then Increase heparin gtt to 1700 units/hr Check 6 hr heparin level at 0200 Daily heparin level and CBC F/u post cath  Thuy D. 12-04-1980, PharmD, BCPS, BCCCP 07/06/2021, 6:58 PM

## 2021-07-06 NOTE — Progress Notes (Signed)
Patient arrived via [facility] to [facility] east. Patient Axox4. Admission assessment completed. MD notified. Patient updated on plan of care. All questions answered. Will continue to monitor patient.

## 2021-07-06 NOTE — ED Notes (Addendum)
Went to discharge patient. Pt stated that his CP has returned. Complaining of 7/10 mid sternal pain. Provider notified. Will do ECG. ABCs intact, alert and oriented x4, respirations even and unlabored.

## 2021-07-06 NOTE — Progress Notes (Signed)
ANTICOAGULATION CONSULT NOTE - Initial Consult  Pharmacy Consult for Heparin Indication: chest pain/ACS  No Known Allergies  Patient Measurements: Height: 6\' 1"  (185.4 cm) Weight: 91.2 kg (201 lb) IBW/kg (Calculated) : 79.9  Vital Signs: Temp: 99.8 F (37.7 C) (09/08 2359) Temp Source: Oral (09/08 2359) BP: 124/91 (09/09 0200) Pulse Rate: 81 (09/09 0200)  Labs: Recent Labs    07/05/21 2157 07/06/21 0100  HGB 13.6  --   HCT 39.2  --   PLT 217  --   CREATININE 1.00  --   TROPONINIHS <2 12,243*    Estimated Creatinine Clearance: 114.3 mL/min (by C-G formula based on SCr of 1 mg/dL).   Medical History: History reviewed. No pertinent past medical history.  Medications:  No current facility-administered medications on file prior to encounter.   Current Outpatient Medications on File Prior to Encounter  Medication Sig Dispense Refill   acetaminophen (TYLENOL) 500 MG tablet Take 1,000 mg by mouth every 6 (six) hours as needed. For pain.     HYDROcodone-acetaminophen (NORCO/VICODIN) 5-325 MG per tablet Take 1 tablet by mouth every 4 (four) hours as needed for pain. 20 tablet 0   ibuprofen (ADVIL,MOTRIN) 200 MG tablet Take 800 mg by mouth every 6 (six) hours as needed. For fever and pain.     ibuprofen (ADVIL,MOTRIN) 800 MG tablet Take 1 tablet (800 mg total) by mouth 3 (three) times daily. 12 tablet 0     Assessment: 40 y.o. male with chest pain for heparin  Goal of Therapy:  Heparin level 0.3-0.7 units/ml Monitor platelets by anticoagulation protocol: Yes   Plan:  Heparin 4000 units IV bolus, then start heparin 1100 units/hr Check heparin level in 6 hours.   30 07/06/2021,2:10 AM

## 2021-07-06 NOTE — H&P (Signed)
Cardiology Admission History and Physical:   Patient ID: Juan Bowers MRN: [id number]; DOB: December 21, 1980   Admission date: 07/05/2021  PCP:  Default, Provider, MD   [facility] Providers Cardiologist:  None        Chief Complaint:  nstemi  Patient Profile:   Juan Bowers is a 40 y.o. male with no past medical history who is being seen 07/06/2021 for the evaluation of NSTEMI.  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 Was in his normal state of health until Sunday when felt sore and stiff all over. Began developing fever to 102, dozens of papules on his back, chest, legs, fatigue, sweats, chills. Yesterday began having intermittent non-exertional chest pain, substernal pressure-like, fairly mild. Presented today to the ED at [facility] for workup of constitutional symptoms and was going to be discharged but developed worsening anginal symptoms, now radiating to the back and accompanied by left arm tingling. Resolved after ativan without need for ntg. Troponin, which at first was <2, went to 12k then to 16k. EKGs without dynamic changes but show inferior and anterior T wave inversions. Accepted to cardiology service at [facility] No Fhx cardiac disease. No drug use. LDL 150s last year but said he lost 20 pounds afterwards No travel, no tickbites  History reviewed. No pertinent past medical history.  History reviewed. No pertinent surgical history.   Medications Prior to Admission: Prior to Admission medications   Medication Sig Start Date End Date Taking? Authorizing Provider  doxycycline (VIBRAMYCIN) 100 MG capsule Take 1 capsule (100 mg total) by mouth 2 (two) times daily. 07/05/21  Yes Delo, Nathaneil Canary, MD  acetaminophen (TYLENOL) 500 MG tablet Take 1,000 mg by mouth every 6 (six) hours as needed. For pain.    [provider]  HYDROcodone-acetaminophen (NORCO/VICODIN) 5-325 MG per tablet Take 1 tablet by mouth every 4 (four) hours as  needed for pain. 09/22/12   Schinlever, Barnetta Chapel, PA-C  ibuprofen (ADVIL,MOTRIN) 200 MG tablet Take 800 mg by mouth every 6 (six) hours as needed. For fever and pain.    [provider]  ibuprofen (ADVIL,MOTRIN) 800 MG tablet Take 1 tablet (800 mg total) by mouth 3 (three) times daily. 09/22/12   Schinlever, Barnetta Chapel, PA-C     Allergies:   No Known Allergies  Social History:   Social History   Socioeconomic History   Marital status: Single    Spouse name: Not on file   Number of children: Not on file   Years of education: Not on file   Highest education level: Not on file  Occupational History   Not on file  Tobacco Use   Smoking status: Never   Smokeless tobacco: Not on file  Substance and Sexual Activity   Alcohol use: Not Currently   Drug use: No   Sexual activity: Not on file  Other Topics Concern   Not on file  Social History Narrative   Not on file   Social Determinants of Health   Financial Resource Strain: Not on file  Food Insecurity: Not on file  Transportation Needs: Not on file  Physical Activity: Not on file  Stress: Not on file  Social Connections: Not on file  Intimate Partner Violence: Not on file    Family History:   The patient's family history is not on file.    ROS:  Please see the history of present illness.  All other ROS reviewed and negative.  Physical Exam/Data:   Vitals:   07/06/21 0200 07/06/21 0330 07/06/21 0400 07/06/21 0441  BP: (!) 124/91 115/85 111/81 103/76  Pulse: 81 74 82 75  Resp: (!) 26 (!) 24 (!) 23 20  Temp:    97.9 F (36.6 C)  TempSrc:    Oral  SpO2: 96% 97% 97% 98%  Weight:    92.4 kg  Height:    6' 1"  (1.854 m)    Intake/Output Summary (Last 24 hours) at 07/06/2021 0547 Last data filed at 07/06/2021 0519 Gross per 24 hour  Intake 71.26 ml  Output --  Net 71.26 ml   Last 3 Weights 07/06/2021 07/05/2021 09/03/2007  Weight (lbs) 203 lb 11.2 oz 201 lb 185 lb  Weight (kg) 92.398 kg 91.173 kg 83.915 kg      Body mass index is 26.87 kg/m.  General:  Well nourished, well developed, in no acute distress HEENT: normal Lymph: no adenopathy Neck: no JVD Endocrine:  No thryomegaly Vascular: No carotid bruits; FA pulses 2+ bilaterally without bruits  Cardiac:  normal S1, S2; RRR; no murmur  Lungs:  clear to auscultation bilaterally, no wheezing, rhonchi or rales  Abd: soft, nontender, no hepatomegaly  Ext: no edema Musculoskeletal:  No deformities, BUE and BLE strength normal and equal Skin: warm and dry. ~20 purplish slightly raised pupules on the chest, abdomen, back, legs  Neuro:  CNs 2-12 intact, no focal abnormalities noted Psych:  Normal affect    EKG:  The ECG that was done 9/9 was personally reviewed and demonstrates NSR with inferior and anterior t wave inversions   Laboratory Data:  High Sensitivity Troponin:   Recent Labs  Lab 07/05/21 2157 07/06/21 0100 07/06/21 0258  TROPONINIHS <2 12,243* 16,108*      Chemistry Recent Labs  Lab 07/05/21 2157  NA 135  K 3.7  CL 97*  CO2 28  GLUCOSE 118*  BUN 8  CREATININE 1.00  CALCIUM 8.9  GFRNONAA >60  ANIONGAP 10    No results for input(s): PROT, ALBUMIN, AST, ALT, ALKPHOS, BILITOT in the last 168 hours. Hematology Recent Labs  Lab 07/05/21 2157  WBC 12.1*  RBC 4.43  HGB 13.6  HCT 39.2  MCV 88.5  MCH 30.7  MCHC 34.7  RDW 13.3  PLT 217   BNPNo results for input(s): BNP, PROBNP in the last 168 hours.  DDimer No results for input(s): DDIMER in the last 168 hours.   Radiology/Studies:  DG Chest 2 View  Result Date: 07/05/2021 CLINICAL DATA:  Fever, chest tightness EXAM: CHEST - 2 VIEW COMPARISON:  None. FINDINGS: The heart size and mediastinal contours are within normal limits. Both lungs are clear. The visualized skeletal structures are unremarkable. IMPRESSION: No active cardiopulmonary disease. Electronically Signed   By: Rolm Baptise M.D.   On: 07/05/2021 20:21     Assessment and Plan:   NSTEMI: Clinical  history, troponin (and troponin trend), EKGs all ocnsistent with a Type I NSTEMI. Patient has few risk factors but perhaps his exanthemous illness provoked an MI. Less likely myocarditis given abrupt increase in troponins but on the differential. Aspirin, heparin gtt. Trend troponins and EKGs. LHC today. Started lipitor high intensity and metoprolol. Prn ntg sl. TTE. If cath negative and TTE unrevealing could consider CMR for myocarditis eval. Risk stratification labs. Febrile illness, rash: Suspect viral or less likely tickborne illness but broad differential. Yesterday negative COVID. Obtaining esr/crp, ddimer, liver enzymes. Hold on ABX for now.   Risk Assessment/Risk Scores:  TIMI Risk Score for Unstable Angina or Non-ST Elevation MI:   The patient's TIMI risk score is 1, which indicates a 5% risk of all cause mortality, new or recurrent myocardial infarction or need for urgent revascularization in the next 14 days.       Severity of Illness: The appropriate patient status for this patient is INPATIENT. Inpatient status is judged to be reasonable and necessary in order to provide the required intensity of service to ensure the patient's safety. The patient's presenting symptoms, physical exam findings, and initial radiographic and laboratory data in the context of their chronic comorbidities is felt to place them at high risk for further clinical deterioration. Furthermore, it is not anticipated that the patient will be medically stable for discharge from the hospital within 2 midnights of admission. The following factors support the patient status of inpatient.   " The patient's presenting symptoms include chest pain, fever. " The worrisome physical exam findings include none. " The initial radiographic and laboratory data are worrisome because of elevated troponins 16k. " The chronic co-morbidities include none.   * I certify that at the point of admission it is my clinical judgment that  the patient will require inpatient hospital care spanning beyond 2 midnights from the point of admission due to high intensity of service, high risk for further deterioration and high frequency of surveillance required.*   For questions or updates, please contact [facility] Please consult [email or web address] for contact info under     Signed, [place] Johnatha Zeidman, MD  07/06/2021 5:47 AM

## 2021-07-06 NOTE — Plan of Care (Addendum)
Paged by RN reporting trop downtrend to 7000s. Patient is stable without HD change or worsening chest pain. Discussed with rounding MD Dr Jens Som, upon evaluation, suspect myocarditis. Recommended coronary CTA, ordered and reached out to coordinator Huntley Dec to arrange imaging today.  MD also request ID consult for possible viral illness given fever and rash, ID consult officially requested, will see patient.

## 2021-07-07 ENCOUNTER — Encounter (HOSPITAL_COMMUNITY): Payer: Self-pay | Admitting: Cardiology

## 2021-07-07 DIAGNOSIS — B3322 Viral myocarditis: Secondary | ICD-10-CM

## 2021-07-07 DIAGNOSIS — R509 Fever, unspecified: Secondary | ICD-10-CM

## 2021-07-07 DIAGNOSIS — I400 Infective myocarditis: Secondary | ICD-10-CM

## 2021-07-07 DIAGNOSIS — M60005 Infective myositis, unspecified leg: Secondary | ICD-10-CM

## 2021-07-07 DIAGNOSIS — R7401 Elevation of levels of liver transaminase levels: Secondary | ICD-10-CM

## 2021-07-07 DIAGNOSIS — B09 Unspecified viral infection characterized by skin and mucous membrane lesions: Secondary | ICD-10-CM

## 2021-07-07 DIAGNOSIS — R21 Rash and other nonspecific skin eruption: Secondary | ICD-10-CM

## 2021-07-07 DIAGNOSIS — I214 Non-ST elevation (NSTEMI) myocardial infarction: Secondary | ICD-10-CM

## 2021-07-07 LAB — COMPREHENSIVE METABOLIC PANEL
ALT: 102 U/L — ABNORMAL HIGH (ref 0–44)
AST: 40 U/L (ref 15–41)
Albumin: 3.1 g/dL — ABNORMAL LOW (ref 3.5–5.0)
Alkaline Phosphatase: 93 U/L (ref 38–126)
Anion gap: 10 (ref 5–15)
BUN: 6 mg/dL (ref 6–20)
CO2: 27 mmol/L (ref 22–32)
Calcium: 8.7 mg/dL — ABNORMAL LOW (ref 8.9–10.3)
Chloride: 101 mmol/L (ref 98–111)
Creatinine, Ser: 1.01 mg/dL (ref 0.61–1.24)
GFR, Estimated: >60 mL/min (ref >60)
Glucose, Bld: 114 mg/dL — ABNORMAL HIGH (ref 70–99)
Potassium: 4.1 mmol/L (ref 3.5–5.1)
Sodium: 138 mmol/L (ref 135–145)
Total Bilirubin: 1 mg/dL (ref 0.3–1.2)
Total Protein: 6.5 g/dL (ref 6.5–8.1)

## 2021-07-07 LAB — CMV ANTIBODY, IGG (EIA): CMV Ab - IgG: <0.6 U/mL (ref 0.00–0.59)

## 2021-07-07 LAB — CBC WITH DIFFERENTIAL/PLATELET
Abs Immature Granulocytes: 0.05 10*3/uL (ref 0.00–0.07)
Basophils Absolute: 0 10*3/uL (ref 0.0–0.1)
Basophils Relative: 0 %
Eosinophils Absolute: 0.1 10*3/uL (ref 0.0–0.5)
Eosinophils Relative: 1 %
HCT: 35.4 % — ABNORMAL LOW (ref 39.0–52.0)
Hemoglobin: 12.5 g/dL — ABNORMAL LOW (ref 13.0–17.0)
Immature Granulocytes: 1 %
Lymphocytes Relative: 20 %
Lymphs Abs: 1.9 10*3/uL (ref 0.7–4.0)
MCH: 31.3 pg (ref 26.0–34.0)
MCHC: 35.3 g/dL (ref 30.0–36.0)
MCV: 88.5 fL (ref 80.0–100.0)
Monocytes Absolute: 0.7 10*3/uL (ref 0.1–1.0)
Monocytes Relative: 7 %
Neutro Abs: 7 10*3/uL (ref 1.7–7.7)
Neutrophils Relative %: 71 %
Platelets: 238 10*3/uL (ref 150–400)
RBC: 4 MIL/uL — ABNORMAL LOW (ref 4.22–5.81)
RDW: 13.3 % (ref 11.5–15.5)
WBC: 9.8 10*3/uL (ref 4.0–10.5)
nRBC: 0 % (ref 0.0–0.2)

## 2021-07-07 LAB — HEPARIN LEVEL (UNFRACTIONATED): Heparin Unfractionated: 0.36 IU/mL (ref 0.30–0.70)

## 2021-07-07 LAB — RESPIRATORY PANEL BY PCR

## 2021-07-07 LAB — TROPONIN I (HIGH SENSITIVITY): Troponin I (High Sensitivity): 3725 ng/L (ref <18)

## 2021-07-07 LAB — HIV-1 RNA QUANT-NO REFLEX-BLD
HIV 1 RNA Quant: <20 copies/mL
LOG10 HIV-1 RNA: UNDETERMINED log10copy/mL

## 2021-07-07 LAB — RPR: RPR Ser Ql: NONREACTIVE

## 2021-07-07 LAB — EBV AB TO VIRAL CAPSID AG PNL, IGG+IGM
EBV VCA IgG: 493 U/mL — ABNORMAL HIGH (ref 0.0–17.9)
EBV VCA IgM: <36 U/mL (ref 0.0–35.9)

## 2021-07-07 LAB — CMV IGM: CMV IgM: <30 AU/mL (ref 0.0–29.9)

## 2021-07-07 LAB — CK: Total CK: 265 U/L (ref 49–397)

## 2021-07-07 LAB — MAGNESIUM: Magnesium: 2.3 mg/dL (ref 1.7–2.4)

## 2021-07-07 MED ORDER — LOSARTAN POTASSIUM 25 MG PO TABS
25.0000 mg | ORAL_TABLET | Freq: Every day | ORAL | Status: DC
  Administered 2021-07-07 – 2021-07-08 (×2): 25 mg via ORAL
  Filled 2021-07-07 (×2): qty 1

## 2021-07-07 MED ORDER — HEPARIN SODIUM (PORCINE) 5000 UNIT/ML IJ SOLN
5000.0000 [IU] | Freq: Three times a day (TID) | INTRAMUSCULAR | Status: DC
  Administered 2021-07-07 – 2021-07-08 (×4): 5000 [IU] via SUBCUTANEOUS
  Filled 2021-07-07 (×4): qty 1

## 2021-07-07 NOTE — Progress Notes (Signed)
Progress Note  Patient Name: Juan Bowers Date of Encounter: 07/07/2021  [facility] Cardiologist: New  Subjective   No CP or dyspnea; complains of headache  Inpatient Medications    Scheduled Meds:  aspirin EC  81 mg Oral Daily   atorvastatin  80 mg Oral Daily   colchicine  0.6 mg Oral BID   metoprolol tartrate  12.5 mg Oral BID   Continuous Infusions:  sodium chloride 10 mL/hr at 07/07/21 0309   heparin 1,700 Units/hr (07/07/21 0309)   PRN Meds: sodium chloride, acetaminophen, nitroGLYCERIN, ondansetron (ZOFRAN) IV   Vital Signs    Vitals:   07/06/21 1519 07/06/21 2037 07/07/21 0020 07/07/21 0420  BP: 107/67 (!) 111/58 103/71 111/74  Pulse: 86 75 73 79  Resp: 20 18 18 18   Temp: 98.9 F (37.2 C) 98.7 F (37.1 C) 99.7 F (37.6 C) 99.5 F (37.5 C)  TempSrc: Oral Oral Oral Oral  SpO2: 100% 100% 98% 98%  Weight:    90.9 kg  Height:        Intake/Output Summary (Last 24 hours) at 07/07/2021 0751 Last data filed at 07/07/2021 0449 Gross per 24 hour  Intake 1005.81 ml  Output 1925 ml  Net -919.19 ml    Last 3 Weights 07/07/2021 07/06/2021 07/05/2021  Weight (lbs) 200 lb 6.4 oz 203 lb 11.2 oz 201 lb  Weight (kg) 90.9 kg 92.398 kg 91.173 kg      Telemetry    Sinus- Personally Reviewed  Physical Exam   GEN: No acute distress.   Neck: No JVD Cardiac: RRR, no rub noted Respiratory: Clear to auscultation bilaterally. GI: Soft, nontender, non-distended  MS: No edema Neuro:  Nonfocal  Psych: Normal affect  Skin: Maculopapular rash  Labs    High Sensitivity Troponin:   Recent Labs  Lab 07/06/21 0100 07/06/21 0258 07/06/21 0756 07/06/21 1347 07/06/21 2109  TROPONINIHS 12,243* 16,108* 7,473* 5,905* 5,318*       Chemistry Recent Labs  Lab 07/05/21 2157 07/06/21 0530  NA 135 136  K 3.7 3.6  CL 97* 99  CO2 28 25  GLUCOSE 118* 118*  BUN 8 6  CREATININE 1.00 0.99  CALCIUM 8.9 9.0  PROT  --  7.0  ALBUMIN  --  3.4*  AST  --  76*  ALT  --   139*  ALKPHOS  --  102  BILITOT  --  1.3*  GFRNONAA >60 >60  ANIONGAP 10 12      Hematology Recent Labs  Lab 07/05/21 2157 07/06/21 0530 07/07/21 0313  WBC 12.1* 10.8* 9.8  RBC 4.43 4.20* 4.00*  HGB 13.6 13.1 12.5*  HCT 39.2 36.7* 35.4*  MCV 88.5 87.4 88.5  MCH 30.7 31.2 31.3  MCHC 34.7 35.7 35.3  RDW 13.3 13.3 13.3  PLT 217 218 238     BNP Recent Labs  Lab 07/06/21 0530  BNP 128.1*      DDimer  Recent Labs  Lab 07/06/21 0756  DDIMER 0.68*      Radiology    DG Chest 2 View  Result Date: 07/05/2021 CLINICAL DATA:  Fever, chest tightness EXAM: CHEST - 2 VIEW COMPARISON:  None. FINDINGS: The heart size and mediastinal contours are within normal limits. Both lungs are clear. The visualized skeletal structures are unremarkable. IMPRESSION: No active cardiopulmonary disease. Electronically Signed   By: 09/04/2021 M.D.   On: 07/05/2021 20:21   CT CORONARY MORPH W/CTA COR W/SCORE W/CA W/CM &/OR WO/CM  Addendum Date: 07/06/2021  ADDENDUM REPORT: 07/06/2021 15:55 HISTORY: Chest pain, nonspecific Significant troponin elevation, fever, chest pain. EXAM: Cardiac/Coronary  CT TECHNIQUE: The patient was scanned on a [employer]. PROTOCOL: A 120 kV prospective scan was triggered in the descending thoracic aorta at 111 HU's. Axial non-contrast 3 mm slices were carried out through the heart. The data set was analyzed on a dedicated work station and scored using the Agatston method. Gantry rotation speed was 250 msecs and collimation was .6 mm. Beta blockade and 0.8 mg of sl NTG was given. The 3D data set was reconstructed in 5% intervals of the 35-75 % of the R-R cycle. Systolic and diastolic phases were analyzed on a dedicated work station using MPR, MIP and VRT modes. The patient received 60mL OMNIPAQUE IOHEXOL 350 MG/ML SOLN contrast. FINDINGS: Image quality: Good Noise artifact is: Limited Coronary calcium score is 0. Coronary arteries: Normal left coronary origin. High  takeoff of the RCA, above the right coronary sinus and ST junction. Right dominance. Right Coronary Artery: No detectable plaque or stenosis. Left Main Coronary Artery: No detectable plaque or stenosis. Left Anterior Descending Coronary Artery: No detectable plaque or stenosis. Left Circumflex Artery: No detectable plaque or stenosis. Aorta: Normal size, 26 mm at the mid ascending aorta (level of the PA bifurcation) measured double oblique. No calcifications. No dissection. Aortic Valve: No calcifications.  Tricuspid aortic valve. Other findings: Normal pulmonary vein drainage into the left atrium. Normal left atrial appendage without thrombus. Normal size of the pulmonary artery. IMPRESSION: 1. No evidence of CAD, CADRADS = 0. 2. Coronary calcium score of 0. 3. Normal L coronary origin. High takeoff of right coronary artery above right coronary cusp and ST junction. Right dominance. Electronically Signed   By: Weston Brass M.D.   On: 07/06/2021 15:55   Result Date: 07/06/2021 EXAM: OVER-READ INTERPRETATION  CT CHEST The following report is an over-read performed by radiologist Dr. Trudie Reed of [facility] on 07/06/2021. This over-read does not include interpretation of cardiac or coronary anatomy or pathology. The coronary calcium score/coronary CTA interpretation by the cardiologist is attached. COMPARISON:  None. FINDINGS: Dependent areas of subsegmental atelectasis are noted in the lower lobes of the lungs bilaterally. Within the visualized portions of the thorax there are no suspicious appearing pulmonary nodules or masses, there is no acute consolidative airspace disease, no pleural effusions, no pneumothorax and no lymphadenopathy. Visualized portions of the upper abdomen are unremarkable. There are no aggressive appearing lytic or blastic lesions noted in the visualized portions of the skeleton. IMPRESSION: 1. No significant incidental noncardiac findings are noted. Electronically  Signed: By: Trudie Reed M.D. On: 07/06/2021 14:50   ECHOCARDIOGRAM COMPLETE  Result Date: 07/06/2021    ECHOCARDIOGRAM REPORT   Patient Name:   Juan Bowers Date of Exam: 07/06/2021 Medical Rec #:  [id number]     Height:       73.0 in Accession #:    [id number]    Weight:       203.7 lb Date of Birth:  1981-05-20    BSA:          2.168 m Patient Age:    39 years      BP:           109/78 mmHg Patient Gender: M             HR:           77 bpm. Exam Location:  Inpatient Procedure: 2D Echo, Cardiac Doppler and  Color Doppler Indications:    MI  History:        Patient has no prior history of Echocardiogram examinations.                 Acute MI.  Sonographer:    MH Referring Phys: [id number] [place] IMPRESSIONS  1. Left ventricular ejection fraction, by estimation, is 60 to 65%. The left ventricle has normal function. The left ventricle has no regional wall motion abnormalities. There is mild left ventricular hypertrophy. Left ventricular diastolic parameters were normal.  2. Right ventricular systolic function is normal. The right ventricular size is normal. There is normal pulmonary artery systolic pressure. The estimated right ventricular systolic pressure is 25.5 mmHg.  3. The mitral valve is normal in structure. Trivial mitral valve regurgitation.  4. The aortic valve is tricuspid. Aortic valve regurgitation is trivial. No aortic stenosis is present.  5. The inferior vena cava is normal in size with greater than 50% respiratory variability, suggesting right atrial pressure of 3 mmHg. FINDINGS  Left Ventricle: Left ventricular ejection fraction, by estimation, is 60 to 65%. The left ventricle has normal function. The left ventricle has no regional wall motion abnormalities. The left ventricular internal cavity size was normal in size. There is  mild left ventricular hypertrophy. Left ventricular diastolic parameters were normal. Right Ventricle: The right ventricular size is normal. No increase in  right ventricular wall thickness. Right ventricular systolic function is normal. There is normal pulmonary artery systolic pressure. The tricuspid regurgitant velocity is 2.37 m/s, and  with an assumed right atrial pressure of 3 mmHg, the estimated right ventricular systolic pressure is 25.5 mmHg. Left Atrium: Left atrial size was normal in size. Right Atrium: Right atrial size was normal in size. Pericardium: There is no evidence of pericardial effusion. Mitral Valve: The mitral valve is normal in structure. Trivial mitral valve regurgitation. Tricuspid Valve: The tricuspid valve is normal in structure. Tricuspid valve regurgitation is trivial. Aortic Valve: The aortic valve is tricuspid. Aortic valve regurgitation is trivial. No aortic stenosis is present. Aortic valve mean gradient measures 3.0 mmHg. Aortic valve peak gradient measures 3.8 mmHg. Aortic valve area, by VTI measures 4.37 cm. Pulmonic Valve: The pulmonic valve was normal in structure. Pulmonic valve regurgitation is trivial. Aorta: The aortic root and ascending aorta are structurally normal, with no evidence of dilitation. Venous: The inferior vena cava is normal in size with greater than 50% respiratory variability, suggesting right atrial pressure of 3 mmHg. IAS/Shunts: The interatrial septum was not well visualized.  LEFT VENTRICLE PLAX 2D LVIDd:         4.80 cm     Diastology LVIDs:         3.00 cm     LV e' medial:    8.70 cm/s LV PW:         1.50 cm     LV E/e' medial:  8.9 LV IVS:        1.30 cm     LV e' lateral:   9.90 cm/s LVOT diam:     2.20 cm     LV E/e' lateral: 7.8 LV SV:         90 LV SV Index:   42 LVOT Area:     3.80 cm  LV Volumes (MOD) LV vol d, MOD A4C: 54.1 ml LV vol s, MOD A4C: 17.0 ml LV SV MOD A4C:     54.1 ml RIGHT VENTRICLE  IVC RV S prime:     10.70 cm/s  IVC diam: 2.00 cm TAPSE (M-mode): 2.9 cm LEFT ATRIUM             Index       RIGHT ATRIUM           Index LA diam:        3.00 cm 1.38 cm/m  RA Area:      18.20 cm LA Vol (A2C):   20.2 ml 9.32 ml/m  RA Volume:   56.50 ml  26.06 ml/m LA Vol (A4C):   39.9 ml 18.40 ml/m LA Biplane Vol: 31.0 ml 14.30 ml/m  AORTIC VALVE                   PULMONIC VALVE AV Area (Vmax):    4.35 cm    PV Vmax:       0.74 m/s AV Area (Vmean):   3.65 cm    PV Peak grad:  2.2 mmHg AV Area (VTI):     4.37 cm AV Vmax:           97.10 cm/s AV Vmean:          77.200 cm/s AV VTI:            0.206 m AV Peak Grad:      3.8 mmHg AV Mean Grad:      3.0 mmHg LVOT Vmax:         111.00 cm/s LVOT Vmean:        74.100 cm/s LVOT VTI:          0.237 m LVOT/AV VTI ratio: 1.15  AORTA Ao Root diam: 3.40 cm Ao Asc diam:  2.90 cm MITRAL VALVE               TRICUSPID VALVE MV Area (PHT): 4.89 cm    TR Peak grad:   22.5 mmHg MV E velocity: 77.60 cm/s  TR Vmax:        237.00 cm/s MV A velocity: 60.60 cm/s MV E/A ratio:  1.28        SHUNTS                            Systemic VTI:  0.24 m                            Systemic Diam: 2.20 cm Epifanio Lesches MD Electronically signed by Epifanio Lesches MD Signature Date/Time: 07/06/2021/1:50:22 PM    Final       Patient Profile     40 y.o. male with recent febrile illness (fevers, chills, general muscle aches) presents with chest pain.  Assessment & Plan    1 myocarditis-cardiac CTA shows no coronary disease and calcium score 0.  Echocardiogram shows normal LV function.  Presentation is consistent with myocarditis given febrile illness and significantly elevated troponin.  COVID is negative as is CMP, hepatitis B, hepatitis C and HIV.  We will continue colchicine and metoprolol; add losartan.  Discontinue aspirin, statin and heparin.  We will plan cardiac MRI following discharge.  I instructed him that he cannot participate in competitive/vigorous activities for 6 months following his bout of myocarditis.  2 febrile illness-etiology unclear.  He had fevers, myalgias and rash.  Infectious disease following.  3 elevated liver functions-follow-up  pending.  Ambulate today and probable discharge tomorrow morning if stable.  For questions or  updates, please contact [facility] Please consult [email or web address] for contact info under        Signed, Olga Millers, MD  07/07/2021, 7:51 AM

## 2021-07-07 NOTE — Progress Notes (Signed)
ANTICOAGULATION CONSULT NOTE  Pharmacy Consult for Heparin Indication: chest pain/ACS  No Known Allergies  Patient Measurements: Height: 6\' 1"  (185.4 cm) Weight: 92.4 kg (203 lb 11.2 oz) IBW/kg (Calculated) : 79.9  Vital Signs: Temp: 99.7 F (37.6 C) (09/10 0020) Temp Source: Oral (09/10 0020) BP: 103/71 (09/10 0020) Pulse Rate: 73 (09/10 0020)  Labs: Recent Labs    07/05/21 2157 07/06/21 0100 07/06/21 0258 07/06/21 0530 07/06/21 0756 07/06/21 1347 07/06/21 1800 07/06/21 2109 07/07/21 0313  HGB 13.6  --   --  13.1  --   --   --   --  12.5*  HCT 39.2  --   --  36.7*  --   --   --   --  35.4*  PLT 217  --   --  218  --   --   --   --  238  LABPROT  --   --   --  15.3*  --   --   --   --   --   INR  --   --   --  1.2  --   --   --   --   --   HEPARINUNFRC  --   --    < >  --  0.16* 0.18* 0.15*  --  0.36  CREATININE 1.00  --   --  0.99  --   --   --   --   --   TROPONINIHS <2   < >  --   --  7,473* 5,905*  --  5,318*  --    < > = values in this interval not displayed.     Estimated Creatinine Clearance: 115.5 mL/min (by C-G formula based on SCr of 0.99 mg/dL).   Assessment: 40 y.o. male with chest pain for heparin  Goal of Therapy:  Heparin level 0.3-0.7 units/ml Monitor platelets by anticoagulation protocol: Yes   Plan:  Continue Heparin at current rate   Taji Barretto, 32 07/07/2021,3:58 AM

## 2021-07-07 NOTE — Progress Notes (Signed)
Subjective:  That patient and family had questions about his rash and his illness. It appears the rash is improving though he still has myalgias in his lower extremities  Antibiotics:  Anti-infectives (From admission, onward)    Start     Dose/Rate Route Frequency Ordered Stop   07/05/21 0000  doxycycline (VIBRAMYCIN) 100 MG capsule        100 mg Oral 2 times daily 07/05/21 2336         Medications: Scheduled Meds:  colchicine  0.6 mg Oral BID   heparin injection (subcutaneous)  5,000 Units Subcutaneous Q8H   losartan  25 mg Oral Daily   metoprolol tartrate  12.5 mg Oral BID   Continuous Infusions:  sodium chloride Stopped (07/07/21 0843)   PRN Meds:.sodium chloride, acetaminophen, nitroGLYCERIN, ondansetron (ZOFRAN) IV    Objective: Weight change: -0.273 kg  Intake/Output Summary (Last 24 hours) at 07/07/2021 1659 Last data filed at 07/07/2021 1340 Gross per 24 hour  Intake 1510.63 ml  Output 1975 ml  Net -464.37 ml   Blood pressure 116/78, pulse 80, temperature 98.6 F (37 C), temperature source Oral, resp. rate 16, height 6\' 1"  (1.854 m), weight 90.9 kg, SpO2 98 %. Temp:  [98.6 F (37 C)-99.7 F (37.6 C)] 98.6 F (37 C) (09/10 0927) Pulse Rate:  [73-80] 80 (09/10 0927) Resp:  [16-18] 16 (09/10 0927) BP: (103-116)/(58-78) 116/78 (09/10 0927) SpO2:  [98 %-100 %] 98 % (09/10 0927) Weight:  [90.9 kg] 90.9 kg (09/10 0420)  Physical Exam: Physical Exam Constitutional:      Appearance: He is well-developed.  HENT:     Head: Normocephalic and atraumatic.  Eyes:     Conjunctiva/sclera: Conjunctivae normal.  Cardiovascular:     Rate and Rhythm: Normal rate and regular rhythm.  Pulmonary:     Effort: Pulmonary effort is normal. No respiratory distress.     Breath sounds: Normal breath sounds. No stridor. No wheezing.  Abdominal:     General: There is no distension.     Palpations: Abdomen is soft.  Musculoskeletal:        General: No swelling,  tenderness, deformity or signs of injury. Normal range of motion.     Cervical back: Normal range of motion and neck supple.     Right lower leg: No edema.     Left lower leg: No edema.  Skin:    General: Skin is warm and dry.     Findings: No erythema or rash.  Neurological:     General: No focal deficit present.     Mental Status: He is alert and oriented to person, place, and time.  Psychiatric:        Mood and Affect: Mood normal.        Behavior: Behavior normal.        Thought Content: Thought content normal.        Judgment: Judgment normal.    Remnants of rash 07/07/2021:       CBC:    BMET Recent Labs    07/06/21 0530 07/07/21 0313  NA 136 138  K 3.6 4.1  CL 99 101  CO2 25 27  GLUCOSE 118* 114*  BUN 6 6  CREATININE 0.99 1.01  CALCIUM 9.0 8.7*     Liver Panel  Recent Labs    07/06/21 0530 07/07/21 0313  PROT 7.0 6.5  ALBUMIN 3.4* 3.1*  AST 76* 40  ALT 139* 102*  ALKPHOS 102 93  BILITOT 1.3* 1.0  BILIDIR 0.3*  --   IBILI 1.0*  --        Sedimentation Rate Recent Labs    07/06/21 0756  ESRSEDRATE 51*   C-Reactive Protein Recent Labs    07/06/21 0756  CRP 13.8*    Micro Results: Recent Results (from the past 720 hour(s))  Resp Panel by RT-PCR (Flu A&B, Covid) Nasopharyngeal Swab     Status: None   Collection Time: 07/06/21  3:16 AM   Specimen: Nasopharyngeal Swab; Nasopharyngeal(NP) swabs in vial transport medium  Result Value Ref Range Status   SARS Coronavirus 2 by RT PCR NEGATIVE NEGATIVE Final    Comment: (NOTE) SARS-CoV-2 target nucleic acids are NOT DETECTED.  The SARS-CoV-2 RNA is generally detectable in upper respiratory specimens during the acute phase of infection. The lowest concentration of SARS-CoV-2 viral copies this assay can detect is 138 copies/mL. A negative result does not preclude SARS-Cov-2 infection and should not be used as the sole basis for treatment or other patient management decisions. A negative  result may occur with  improper specimen collection/handling, submission of specimen other than nasopharyngeal swab, presence of viral mutation(s) within the areas targeted by this assay, and inadequate number of viral copies(<138 copies/mL). A negative result must be combined with clinical observations, patient history, and epidemiological information. The expected result is Negative.  Fact Sheet for Patients:  BloggerCourse.com  Fact Sheet for Healthcare Providers:  SeriousBroker.it  This test is no t yet approved or cleared by the [place] FDA and  has been authorized for detection and/or diagnosis of SARS-CoV-2 by FDA under an Emergency Use Authorization (EUA). This EUA will remain  in effect (meaning this test can be used) for the duration of the COVID-19 declaration under Section 564(b)(1) of the Act, 21 U.S.C.section 360bbb-3(b)(1), unless the authorization is terminated  or revoked sooner.       Influenza A by PCR NEGATIVE NEGATIVE Final   Influenza B by PCR NEGATIVE NEGATIVE Final    Comment: (NOTE) The Xpert Xpress SARS-CoV-2/FLU/RSV plus assay is intended as an aid in the diagnosis of influenza from Nasopharyngeal swab specimens and should not be used as a sole basis for treatment. Nasal washings and aspirates are unacceptable for Xpert Xpress SARS-CoV-2/FLU/RSV testing.  Fact Sheet for Patients: BloggerCourse.com  Fact Sheet for Healthcare Providers: SeriousBroker.it  This test is not yet approved or cleared by the [place] FDA and has been authorized for detection and/or diagnosis of SARS-CoV-2 by FDA under an Emergency Use Authorization (EUA). This EUA will remain in effect (meaning this test can be used) for the duration of the COVID-19 declaration under Section 564(b)(1) of the Act, 21 U.S.C. section 360bbb-3(b)(1), unless the authorization is  terminated or revoked.  Performed at [facility], [address] [place], [place] [address]     Studies/Results: DG Chest 2 View  Result Date: 07/05/2021 CLINICAL DATA:  Fever, chest tightness EXAM: CHEST - 2 VIEW COMPARISON:  None. FINDINGS: The heart size and mediastinal contours are within normal limits. Both lungs are clear. The visualized skeletal structures are unremarkable. IMPRESSION: No active cardiopulmonary disease. Electronically Signed   By: Charlett Nose M.D.   On: 07/05/2021 20:21   CT CORONARY MORPH W/CTA COR W/SCORE W/CA W/CM &/OR WO/CM  Addendum Date: 07/06/2021   ADDENDUM REPORT: 07/06/2021 15:55 HISTORY: Chest pain, nonspecific Significant troponin elevation, fever, chest pain. EXAM: Cardiac/Coronary  CT TECHNIQUE: The patient was scanned on a [employer]. PROTOCOL: A 120  kV prospective scan was triggered in the descending thoracic aorta at 111 HU's. Axial non-contrast 3 mm slices were carried out through the heart. The data set was analyzed on a dedicated work station and scored using the Agatston method. Gantry rotation speed was 250 msecs and collimation was .6 mm. Beta blockade and 0.8 mg of sl NTG was given. The 3D data set was reconstructed in 5% intervals of the 35-75 % of the R-R cycle. Systolic and diastolic phases were analyzed on a dedicated work station using MPR, MIP and VRT modes. The patient received 95mL OMNIPAQUE IOHEXOL 350 MG/ML SOLN contrast. FINDINGS: Image quality: Good Noise artifact is: Limited Coronary calcium score is 0. Coronary arteries: Normal left coronary origin. High takeoff of the RCA, above the right coronary sinus and ST junction. Right dominance. Right Coronary Artery: No detectable plaque or stenosis. Left Main Coronary Artery: No detectable plaque or stenosis. Left Anterior Descending Coronary Artery: No detectable plaque or stenosis. Left Circumflex Artery: No detectable plaque or stenosis. Aorta: Normal size, 26  mm at the mid ascending aorta (level of the PA bifurcation) measured double oblique. No calcifications. No dissection. Aortic Valve: No calcifications.  Tricuspid aortic valve. Other findings: Normal pulmonary vein drainage into the left atrium. Normal left atrial appendage without thrombus. Normal size of the pulmonary artery. IMPRESSION: 1. No evidence of CAD, CADRADS = 0. 2. Coronary calcium score of 0. 3. Normal L coronary origin. High takeoff of right coronary artery above right coronary cusp and ST junction. Right dominance. Electronically Signed   By: Weston Brass M.D.   On: 07/06/2021 15:55   Result Date: 07/06/2021 EXAM: OVER-READ INTERPRETATION  CT CHEST The following report is an over-read performed by radiologist Dr. Trudie Reed of [facility] on 07/06/2021. This over-read does not include interpretation of cardiac or coronary anatomy or pathology. The coronary calcium score/coronary CTA interpretation by the cardiologist is attached. COMPARISON:  None. FINDINGS: Dependent areas of subsegmental atelectasis are noted in the lower lobes of the lungs bilaterally. Within the visualized portions of the thorax there are no suspicious appearing pulmonary nodules or masses, there is no acute consolidative airspace disease, no pleural effusions, no pneumothorax and no lymphadenopathy. Visualized portions of the upper abdomen are unremarkable. There are no aggressive appearing lytic or blastic lesions noted in the visualized portions of the skeleton. IMPRESSION: 1. No significant incidental noncardiac findings are noted. Electronically Signed: By: Trudie Reed M.D. On: 07/06/2021 14:50   ECHOCARDIOGRAM COMPLETE  Result Date: 07/06/2021    ECHOCARDIOGRAM REPORT   Patient Name:   RODARIUS KICHLINE Date of Exam: 07/06/2021 Medical Rec #:  [id number]     Height:       73.0 in Accession #:    [id number]    Weight:       203.7 lb Date of Birth:  1981-09-14    BSA:          2.168 m Patient Age:    39  years      BP:           109/78 mmHg Patient Gender: M             HR:           77 bpm. Exam Location:  Inpatient Procedure: 2D Echo, Cardiac Doppler and Color Doppler Indications:    MI  History:        Patient has no prior history of Echocardiogram examinations.  Acute MI.  Sonographer:    MH Referring Phys: [id number] [place] IMPRESSIONS  1. Left ventricular ejection fraction, by estimation, is 60 to 65%. The left ventricle has normal function. The left ventricle has no regional wall motion abnormalities. There is mild left ventricular hypertrophy. Left ventricular diastolic parameters were normal.  2. Right ventricular systolic function is normal. The right ventricular size is normal. There is normal pulmonary artery systolic pressure. The estimated right ventricular systolic pressure is 25.5 mmHg.  3. The mitral valve is normal in structure. Trivial mitral valve regurgitation.  4. The aortic valve is tricuspid. Aortic valve regurgitation is trivial. No aortic stenosis is present.  5. The inferior vena cava is normal in size with greater than 50% respiratory variability, suggesting right atrial pressure of 3 mmHg. FINDINGS  Left Ventricle: Left ventricular ejection fraction, by estimation, is 60 to 65%. The left ventricle has normal function. The left ventricle has no regional wall motion abnormalities. The left ventricular internal cavity size was normal in size. There is  mild left ventricular hypertrophy. Left ventricular diastolic parameters were normal. Right Ventricle: The right ventricular size is normal. No increase in right ventricular wall thickness. Right ventricular systolic function is normal. There is normal pulmonary artery systolic pressure. The tricuspid regurgitant velocity is 2.37 m/s, and  with an assumed right atrial pressure of 3 mmHg, the estimated right ventricular systolic pressure is 25.5 mmHg. Left Atrium: Left atrial size was normal in size. Right Atrium: Right  atrial size was normal in size. Pericardium: There is no evidence of pericardial effusion. Mitral Valve: The mitral valve is normal in structure. Trivial mitral valve regurgitation. Tricuspid Valve: The tricuspid valve is normal in structure. Tricuspid valve regurgitation is trivial. Aortic Valve: The aortic valve is tricuspid. Aortic valve regurgitation is trivial. No aortic stenosis is present. Aortic valve mean gradient measures 3.0 mmHg. Aortic valve peak gradient measures 3.8 mmHg. Aortic valve area, by VTI measures 4.37 cm. Pulmonic Valve: The pulmonic valve was normal in structure. Pulmonic valve regurgitation is trivial. Aorta: The aortic root and ascending aorta are structurally normal, with no evidence of dilitation. Venous: The inferior vena cava is normal in size with greater than 50% respiratory variability, suggesting right atrial pressure of 3 mmHg. IAS/Shunts: The interatrial septum was not well visualized.  LEFT VENTRICLE PLAX 2D LVIDd:         4.80 cm     Diastology LVIDs:         3.00 cm     LV e' medial:    8.70 cm/s LV PW:         1.50 cm     LV E/e' medial:  8.9 LV IVS:        1.30 cm     LV e' lateral:   9.90 cm/s LVOT diam:     2.20 cm     LV E/e' lateral: 7.8 LV SV:         90 LV SV Index:   42 LVOT Area:     3.80 cm  LV Volumes (MOD) LV vol d, MOD A4C: 54.1 ml LV vol s, MOD A4C: 17.0 ml LV SV MOD A4C:     54.1 ml RIGHT VENTRICLE             IVC RV S prime:     10.70 cm/s  IVC diam: 2.00 cm TAPSE (M-mode): 2.9 cm LEFT ATRIUM             Index  RIGHT ATRIUM           Index LA diam:        3.00 cm 1.38 cm/m  RA Area:     18.20 cm LA Vol (A2C):   20.2 ml 9.32 ml/m  RA Volume:   56.50 ml  26.06 ml/m LA Vol (A4C):   39.9 ml 18.40 ml/m LA Biplane Vol: 31.0 ml 14.30 ml/m  AORTIC VALVE                   PULMONIC VALVE AV Area (Vmax):    4.35 cm    PV Vmax:       0.74 m/s AV Area (Vmean):   3.65 cm    PV Peak grad:  2.2 mmHg AV Area (VTI):     4.37 cm AV Vmax:           97.10 cm/s AV  Vmean:          77.200 cm/s AV VTI:            0.206 m AV Peak Grad:      3.8 mmHg AV Mean Grad:      3.0 mmHg LVOT Vmax:         111.00 cm/s LVOT Vmean:        74.100 cm/s LVOT VTI:          0.237 m LVOT/AV VTI ratio: 1.15  AORTA Ao Root diam: 3.40 cm Ao Asc diam:  2.90 cm MITRAL VALVE               TRICUSPID VALVE MV Area (PHT): 4.89 cm    TR Peak grad:   22.5 mmHg MV E velocity: 77.60 cm/s  TR Vmax:        237.00 cm/s MV A velocity: 60.60 cm/s MV E/A ratio:  1.28        SHUNTS                            Systemic VTI:  0.24 m                            Systemic Diam: 2.20 cm Epifanio Lesches MD Electronically signed by Epifanio Lesches MD Signature Date/Time: 07/06/2021/1:50:22 PM    Final       Assessment/Plan:  INTERVAL HISTORY: All serologies of come back so far have been unrevealing   Active Problems:   NSTEMI (non-ST elevated myocardial infarction) (HCC)    Juan Bowers is a 40 y.o. male admitted with fevers rash chest pain and findings consistent with myocarditis.  He also has I will transaminitis on labs.  #1 Febrile illness with hepatitis and myocarditis and rash:  I agree with Dr. Luciana Axe that this is most likely a viral illness  COVID-19 and influenza have been excluded  His hepatitis panel is negative   HIV fourth-generation test and HIV RNA are negative  CMV titers are negative and RPR is negative  I am sending off a full respiratory virus panel to look for other respiratory viruses that could cause his illness  Sending off a coxsackievirus antibody panel certainly coxsackievirus B is known to cause myocarditis  Family of asked me about Lyme disease and point to a particular lesion on his leg.  This lesion is not consistent with erythema migrans.  However I will send off Lyme serologies because I am worried someone else who does  not interpret them will order them.  He seems to be having resolution of his illness and I would expect he could be discharged to  home soon.  We would be happy to see him for follow-up in our clinic  I spent 38  minutes with the patient including face to face counseling of the patient and his family regarding the differential diagnosis of myocarditis including infectious causes autoimmune causes with my clinical suspicion being for the former particular viral infection, personally reviewing his CT coronary study his echocardiogram his labs that I have mentioned above along review of medical records before and during the visit and in coordination of his care.     LOS: 1 day   Acey LavCornelius Van Dam 07/07/2021, 4:59 PM

## 2021-07-08 ENCOUNTER — Other Ambulatory Visit: Payer: Self-pay | Admitting: Physician Assistant

## 2021-07-08 DIAGNOSIS — I400 Infective myocarditis: Secondary | ICD-10-CM | POA: Diagnosis not present

## 2021-07-08 DIAGNOSIS — R0789 Other chest pain: Secondary | ICD-10-CM

## 2021-07-08 LAB — BASIC METABOLIC PANEL
Anion gap: 7 (ref 5–15)
BUN: <5 mg/dL — ABNORMAL LOW (ref 6–20)
CO2: 28 mmol/L (ref 22–32)
Calcium: 8.7 mg/dL — ABNORMAL LOW (ref 8.9–10.3)
Chloride: 102 mmol/L (ref 98–111)
Creatinine, Ser: 0.96 mg/dL (ref 0.61–1.24)
GFR, Estimated: >60 mL/min (ref >60)
Glucose, Bld: 135 mg/dL — ABNORMAL HIGH (ref 70–99)
Potassium: 3.9 mmol/L (ref 3.5–5.1)
Sodium: 137 mmol/L (ref 135–145)

## 2021-07-08 LAB — CBC
HCT: 37 % — ABNORMAL LOW (ref 39.0–52.0)
Hemoglobin: 13.1 g/dL (ref 13.0–17.0)
MCH: 31.5 pg (ref 26.0–34.0)
MCHC: 35.4 g/dL (ref 30.0–36.0)
MCV: 88.9 fL (ref 80.0–100.0)
Platelets: 277 10*3/uL (ref 150–400)
RBC: 4.16 MIL/uL — ABNORMAL LOW (ref 4.22–5.81)
RDW: 13.2 % (ref 11.5–15.5)
WBC: 9.3 10*3/uL (ref 4.0–10.5)
nRBC: 0 % (ref 0.0–0.2)

## 2021-07-08 LAB — MAGNESIUM: Magnesium: 2.4 mg/dL (ref 1.7–2.4)

## 2021-07-08 MED ORDER — COLCHICINE 0.6 MG PO TABS: 0.6000 mg | ORAL_TABLET | Freq: Every day | ORAL | Status: DC

## 2021-07-08 MED ORDER — METOPROLOL TARTRATE 25 MG PO TABS: 12.5000 mg | ORAL_TABLET | Freq: Two times a day (BID) | ORAL | 2 refills | Status: DC

## 2021-07-08 MED ORDER — LOSARTAN POTASSIUM 25 MG PO TABS: 25.0000 mg | ORAL_TABLET | Freq: Every day | ORAL | 2 refills | Status: DC

## 2021-07-08 MED ORDER — COLCHICINE 0.6 MG PO TABS: 0.6000 mg | ORAL_TABLET | Freq: Every day | ORAL | 0 refills | Status: DC

## 2021-07-08 NOTE — Plan of Care (Signed)

## 2021-07-08 NOTE — Progress Notes (Signed)
Progress Note  Patient Name: Juan Bowers Date of Encounter: 07/08/2021  [facility] Cardiologist: New  Subjective   No CP or dyspnea  Inpatient Medications    Scheduled Meds:  colchicine  0.6 mg Oral BID   heparin injection (subcutaneous)  5,000 Units Subcutaneous Q8H   losartan  25 mg Oral Daily   metoprolol tartrate  12.5 mg Oral BID   Continuous Infusions:  sodium chloride Stopped (07/07/21 0843)   PRN Meds: sodium chloride, acetaminophen, nitroGLYCERIN, ondansetron (ZOFRAN) IV   Vital Signs    Vitals:   07/07/21 0420 07/07/21 0927 07/07/21 1922 07/08/21 0357  BP: 111/74 116/78 112/70 115/73  Pulse: 79 80 74 (!) 57  Resp: 18 16 18 18   Temp: 99.5 F (37.5 C) 98.6 F (37 C) 98.2 F (36.8 C) 98.2 F (36.8 C)  TempSrc: Oral Oral Oral Oral  SpO2: 98% 98% 99% 97%  Weight: 90.9 kg   90.4 kg  Height:        Intake/Output Summary (Last 24 hours) at 07/08/2021 1013 Last data filed at 07/08/2021 0903 Gross per 24 hour  Intake 838 ml  Output 2150 ml  Net -1312 ml    Last 3 Weights 07/08/2021 07/07/2021 07/06/2021  Weight (lbs) 199 lb 6.4 oz 200 lb 6.4 oz 203 lb 11.2 oz  Weight (kg) 90.447 kg 90.9 kg 92.398 kg      Telemetry    Sinus- Personally Reviewed  Physical Exam   GEN: NAD Neck: Supple Cardiac: RRR Respiratory: CTA GI: Soft, NT/ND MS: No edema Neuro:  Grossly intact Psych: Normal affect  Skin: Maculopapular rash  Labs    High Sensitivity Troponin:   Recent Labs  Lab 07/06/21 0258 07/06/21 0756 07/06/21 1347 07/06/21 2109 07/07/21 0313  TROPONINIHS 16,108* 7,473* 5,905* 5,318* 3,725*       Chemistry Recent Labs  Lab 07/06/21 0530 07/07/21 0313 07/08/21 0157  NA 136 138 137  K 3.6 4.1 3.9  CL 99 101 102  CO2 25 27 28   GLUCOSE 118* 114* 135*  BUN 6 6 <5*  CREATININE 0.99 1.01 0.96  CALCIUM 9.0 8.7* 8.7*  PROT 7.0 6.5  --   ALBUMIN 3.4* 3.1*  --   AST 76* 40  --   ALT 139* 102*  --   ALKPHOS 102 93  --   BILITOT 1.3*  1.0  --   GFRNONAA >60 >60 >60  ANIONGAP 12 10 7       Hematology Recent Labs  Lab 07/06/21 0530 07/07/21 0313 07/08/21 0157  WBC 10.8* 9.8 9.3  RBC 4.20* 4.00* 4.16*  HGB 13.1 12.5* 13.1  HCT 36.7* 35.4* 37.0*  MCV 87.4 88.5 88.9  MCH 31.2 31.3 31.5  MCHC 35.7 35.3 35.4  RDW 13.3 13.3 13.2  PLT 218 238 277     BNP Recent Labs  Lab 07/06/21 0530  BNP 128.1*      DDimer  Recent Labs  Lab 07/06/21 0756  DDIMER 0.68*      Radiology    CT CORONARY MORPH W/CTA COR W/SCORE W/CA W/CM &/OR WO/CM  Addendum Date: 07/06/2021   ADDENDUM REPORT: 07/06/2021 15:55 HISTORY: Chest pain, nonspecific Significant troponin elevation, fever, chest pain. EXAM: Cardiac/Coronary  CT TECHNIQUE: The patient was scanned on a 09/05/21. PROTOCOL: A 120 kV prospective scan was triggered in the descending thoracic aorta at 111 HU's. Axial non-contrast 3 mm slices were carried out through the heart. The data set was analyzed on a dedicated work station  and scored using the Agatston method. Gantry rotation speed was 250 msecs and collimation was .6 mm. Beta blockade and 0.8 mg of sl NTG was given. The 3D data set was reconstructed in 5% intervals of the 35-75 % of the R-R cycle. Systolic and diastolic phases were analyzed on a dedicated work station using MPR, MIP and VRT modes. The patient received 95mL OMNIPAQUE IOHEXOL 350 MG/ML SOLN contrast. FINDINGS: Image quality: Good Noise artifact is: Limited Coronary calcium score is 0. Coronary arteries: Normal left coronary origin. High takeoff of the RCA, above the right coronary sinus and ST junction. Right dominance. Right Coronary Artery: No detectable plaque or stenosis. Left Main Coronary Artery: No detectable plaque or stenosis. Left Anterior Descending Coronary Artery: No detectable plaque or stenosis. Left Circumflex Artery: No detectable plaque or stenosis. Aorta: Normal size, 26 mm at the mid ascending aorta (level of the PA bifurcation)  measured double oblique. No calcifications. No dissection. Aortic Valve: No calcifications.  Tricuspid aortic valve. Other findings: Normal pulmonary vein drainage into the left atrium. Normal left atrial appendage without thrombus. Normal size of the pulmonary artery. IMPRESSION: 1. No evidence of CAD, CADRADS = 0. 2. Coronary calcium score of 0. 3. Normal L coronary origin. High takeoff of right coronary artery above right coronary cusp and ST junction. Right dominance. Electronically Signed   By: Weston BrassGayatri  Acharya M.D.   On: 07/06/2021 15:55   Result Date: 07/06/2021 EXAM: OVER-READ INTERPRETATION  CT CHEST The following report is an over-read performed by radiologist Dr. Trudie Reedaniel Entrikin of [facility] on 07/06/2021. This over-read does not include interpretation of cardiac or coronary anatomy or pathology. The coronary calcium score/coronary CTA interpretation by the cardiologist is attached. COMPARISON:  None. FINDINGS: Dependent areas of subsegmental atelectasis are noted in the lower lobes of the lungs bilaterally. Within the visualized portions of the thorax there are no suspicious appearing pulmonary nodules or masses, there is no acute consolidative airspace disease, no pleural effusions, no pneumothorax and no lymphadenopathy. Visualized portions of the upper abdomen are unremarkable. There are no aggressive appearing lytic or blastic lesions noted in the visualized portions of the skeleton. IMPRESSION: 1. No significant incidental noncardiac findings are noted. Electronically Signed: By: Trudie Reedaniel  Entrikin M.D. On: 07/06/2021 14:50   ECHOCARDIOGRAM COMPLETE  Result Date: 07/06/2021    ECHOCARDIOGRAM REPORT   Patient Name:   Juan Bowers Date of Exam: 07/06/2021 Medical Rec #:  [id number]     Height:       73.0 in Accession #:    [id number]    Weight:       203.7 lb Date of Birth:  [birth year]    BSA:          2.168 m Patient Age:    40 years      BP:           109/78 mmHg Patient Gender: M              HR:           77 bpm. Exam Location:  Inpatient Procedure: 2D Echo, Cardiac Doppler and Color Doppler Indications:    MI  History:        Patient has no prior history of Echocardiogram examinations.                 Acute MI.  Sonographer:    MH Referring Phys: [id number] [place]ORDAN TANNENBAUM IMPRESSIONS  1. Left ventricular ejection fraction, by estimation, is 60 to  65%. The left ventricle has normal function. The left ventricle has no regional wall motion abnormalities. There is mild left ventricular hypertrophy. Left ventricular diastolic parameters were normal.  2. Right ventricular systolic function is normal. The right ventricular size is normal. There is normal pulmonary artery systolic pressure. The estimated right ventricular systolic pressure is 25.5 mmHg.  3. The mitral valve is normal in structure. Trivial mitral valve regurgitation.  4. The aortic valve is tricuspid. Aortic valve regurgitation is trivial. No aortic stenosis is present.  5. The inferior vena cava is normal in size with greater than 50% respiratory variability, suggesting right atrial pressure of 3 mmHg. FINDINGS  Left Ventricle: Left ventricular ejection fraction, by estimation, is 60 to 65%. The left ventricle has normal function. The left ventricle has no regional wall motion abnormalities. The left ventricular internal cavity size was normal in size. There is  mild left ventricular hypertrophy. Left ventricular diastolic parameters were normal. Right Ventricle: The right ventricular size is normal. No increase in right ventricular wall thickness. Right ventricular systolic function is normal. There is normal pulmonary artery systolic pressure. The tricuspid regurgitant velocity is 2.37 m/s, and  with an assumed right atrial pressure of 3 mmHg, the estimated right ventricular systolic pressure is 25.5 mmHg. Left Atrium: Left atrial size was normal in size. Right Atrium: Right atrial size was normal in size. Pericardium: There is no  evidence of pericardial effusion. Mitral Valve: The mitral valve is normal in structure. Trivial mitral valve regurgitation. Tricuspid Valve: The tricuspid valve is normal in structure. Tricuspid valve regurgitation is trivial. Aortic Valve: The aortic valve is tricuspid. Aortic valve regurgitation is trivial. No aortic stenosis is present. Aortic valve mean gradient measures 3.0 mmHg. Aortic valve peak gradient measures 3.8 mmHg. Aortic valve area, by VTI measures 4.37 cm. Pulmonic Valve: The pulmonic valve was normal in structure. Pulmonic valve regurgitation is trivial. Aorta: The aortic root and ascending aorta are structurally normal, with no evidence of dilitation. Venous: The inferior vena cava is normal in size with greater than 50% respiratory variability, suggesting right atrial pressure of 3 mmHg. IAS/Shunts: The interatrial septum was not well visualized.  LEFT VENTRICLE PLAX 2D LVIDd:         4.80 cm     Diastology LVIDs:         3.00 cm     LV e' medial:    8.70 cm/s LV PW:         1.50 cm     LV E/e' medial:  8.9 LV IVS:        1.30 cm     LV e' lateral:   9.90 cm/s LVOT diam:     2.20 cm     LV E/e' lateral: 7.8 LV SV:         90 LV SV Index:   42 LVOT Area:     3.80 cm  LV Volumes (MOD) LV vol d, MOD A4C: 54.1 ml LV vol s, MOD A4C: 17.0 ml LV SV MOD A4C:     54.1 ml RIGHT VENTRICLE             IVC RV S prime:     10.70 cm/s  IVC diam: 2.00 cm TAPSE (M-mode): 2.9 cm LEFT ATRIUM             Index       RIGHT ATRIUM           Index LA diam:  3.00 cm 1.38 cm/m  RA Area:     18.20 cm LA Vol (A2C):   20.2 ml 9.32 ml/m  RA Volume:   56.50 ml  26.06 ml/m LA Vol (A4C):   39.9 ml 18.40 ml/m LA Biplane Vol: 31.0 ml 14.30 ml/m  AORTIC VALVE                   PULMONIC VALVE AV Area (Vmax):    4.35 cm    PV Vmax:       0.74 m/s AV Area (Vmean):   3.65 cm    PV Peak grad:  2.2 mmHg AV Area (VTI):     4.37 cm AV Vmax:           97.10 cm/s AV Vmean:          77.200 cm/s AV VTI:            0.206 m AV  Peak Grad:      3.8 mmHg AV Mean Grad:      3.0 mmHg LVOT Vmax:         111.00 cm/s LVOT Vmean:        74.100 cm/s LVOT VTI:          0.237 m LVOT/AV VTI ratio: 1.15  AORTA Ao Root diam: 3.40 cm Ao Asc diam:  2.90 cm MITRAL VALVE               TRICUSPID VALVE MV Area (PHT): 4.89 cm    TR Peak grad:   22.5 mmHg MV E velocity: 77.60 cm/s  TR Vmax:        237.00 cm/s MV A velocity: 60.60 cm/s MV E/A ratio:  1.28        SHUNTS                            Systemic VTI:  0.24 m                            Systemic Diam: 2.20 cm Epifanio Lesches MD Electronically signed by Epifanio Lesches MD Signature Date/Time: 07/06/2021/1:50:22 PM    Final       Patient Profile     40 y.o. male with recent febrile illness (fevers, chills, general muscle aches) presents with chest pain.  Assessment & Plan    1 myocarditis-cardiac CTA shows no coronary disease and calcium score 0.  Echocardiogram shows normal LV function.  Presentation is consistent with myocarditis given febrile illness and significantly elevated troponin.  COVID is negative as is CMP, hepatitis B, hepatitis C and HIV.  We will continue low-dose metoprolol, ARB and decrease colchicine to 0.6 mg daily.  We will arrange cardiac MRI following discharge.  I instructed him that he cannot participate in competitive/vigorous activities for 6 months following his bout of myocarditis.  No arrhythmias noted on telemetry.  2 febrile illness-etiology unclear.  He had fevers, myalgias and rash.  Infectious disease has reviewed and will follow-up as an outpatient.  Discharge today on present medications.  We will arrange outpatient cardiac MRI.  Follow-up with APP in 4 weeks and me in 4 months.  I will likely discontinue colchicine in 4 to 6 weeks and metoprolol/losartan in 6 months.  Would also arrange follow-up with ID.  Greater than 30 minutes PA and physician time. D2  For questions or updates, please contact [facility] Please consult [email or web address]  for contact info under        Signed, Olga Millers, MD  07/08/2021, 10:13 AM

## 2021-07-08 NOTE — Progress Notes (Signed)
Subjective: No new complaints   Antibiotics:  Anti-infectives (From admission, onward)    Start     Dose/Rate Route Frequency Ordered Stop   07/05/21 0000  doxycycline (VIBRAMYCIN) 100 MG capsule        100 mg Oral 2 times daily 07/05/21 2336         Medications: Scheduled Meds:  [START ON 07/09/2021] colchicine  0.6 mg Oral Daily   heparin injection (subcutaneous)  5,000 Units Subcutaneous Q8H   losartan  25 mg Oral Daily   metoprolol tartrate  12.5 mg Oral BID   Continuous Infusions:  sodium chloride Stopped (07/07/21 0843)   PRN Meds:.sodium chloride, acetaminophen, nitroGLYCERIN, ondansetron (ZOFRAN) IV    Objective: Weight change: -0.453 kg  Intake/Output Summary (Last 24 hours) at 07/08/2021 1249 Last data filed at 07/08/2021 0903 Gross per 24 hour  Intake 598 ml  Output 1875 ml  Net -1277 ml   Blood pressure 121/86, pulse 64, temperature 98.5 F (36.9 C), temperature source Oral, resp. rate 19, height 6\' 1"  (1.854 m), weight 90.4 kg, SpO2 100 %. Temp:  [98.2 F (36.8 C)-98.5 F (36.9 C)] 98.5 F (36.9 C) (09/11 1122) Pulse Rate:  [57-74] 64 (09/11 1122) Resp:  [18-19] 19 (09/11 1122) BP: (112-121)/(70-86) 121/86 (09/11 1122) SpO2:  [97 %-100 %] 100 % (09/11 1122) Weight:  [90.4 kg] 90.4 kg (09/11 0357)  Physical Exam: Physical Exam Constitutional:      Appearance: He is well-developed.  HENT:     Head: Normocephalic and atraumatic.  Eyes:     General:        Right eye: No discharge.        Left eye: No discharge.     Conjunctiva/sclera: Conjunctivae normal.  Cardiovascular:     Rate and Rhythm: Normal rate and regular rhythm.  Pulmonary:     Effort: Pulmonary effort is normal. No respiratory distress.     Breath sounds: Normal breath sounds. No stridor. No wheezing.  Abdominal:     General: There is no distension.     Palpations: Abdomen is soft.  Musculoskeletal:        General: Normal range of motion.     Cervical back: Normal  range of motion and neck supple.  Skin:    General: Skin is warm and dry.     Findings: No erythema or rash.  Neurological:     General: No focal deficit present.     Mental Status: He is alert and oriented to person, place, and time.  Psychiatric:        Mood and Affect: Mood normal.        Behavior: Behavior normal.        Thought Content: Thought content normal.        Judgment: Judgment normal.    Rash improving CBC:    BMET Recent Labs    07/07/21 0313 07/08/21 0157  NA 138 137  K 4.1 3.9  CL 101 102  CO2 27 28  GLUCOSE 114* 135*  BUN 6 <5*  CREATININE 1.01 0.96  CALCIUM 8.7* 8.7*     Liver Panel  Recent Labs    07/06/21 0530 07/07/21 0313  PROT 7.0 6.5  ALBUMIN 3.4* 3.1*  AST 76* 40  ALT 139* 102*  ALKPHOS 102 93  BILITOT 1.3* 1.0  BILIDIR 0.3*  --   IBILI 1.0*  --        Sedimentation Rate Recent Labs  07/06/21 0756  ESRSEDRATE 51*   C-Reactive Protein Recent Labs    07/06/21 0756  CRP 13.8*    Micro Results: Recent Results (from the past 720 hour(s))  Resp Panel by RT-PCR (Flu A&B, Covid) Nasopharyngeal Swab     Status: None   Collection Time: 07/06/21  3:16 AM   Specimen: Nasopharyngeal Swab; Nasopharyngeal(NP) swabs in vial transport medium  Result Value Ref Range Status   SARS Coronavirus 2 by RT PCR NEGATIVE NEGATIVE Final    Comment: (NOTE) SARS-CoV-2 target nucleic acids are NOT DETECTED.  The SARS-CoV-2 RNA is generally detectable in upper respiratory specimens during the acute phase of infection. The lowest concentration of SARS-CoV-2 viral copies this assay can detect is 138 copies/mL. A negative result does not preclude SARS-Cov-2 infection and should not be used as the sole basis for treatment or other patient management decisions. A negative result may occur with  improper specimen collection/handling, submission of specimen other than nasopharyngeal swab, presence of viral mutation(s) within the areas targeted  by this assay, and inadequate number of viral copies(<138 copies/mL). A negative result must be combined with clinical observations, patient history, and epidemiological information. The expected result is Negative.  Fact Sheet for Patients:  BloggerCourse.com  Fact Sheet for Healthcare Providers:  SeriousBroker.it  This test is no t yet approved or cleared by the [place] FDA and  has been authorized for detection and/or diagnosis of SARS-CoV-2 by FDA under an Emergency Use Authorization (EUA). This EUA will remain  in effect (meaning this test can be used) for the duration of the COVID-19 declaration under Section 564(b)(1) of the Act, 21 U.S.C.section 360bbb-3(b)(1), unless the authorization is terminated  or revoked sooner.       Influenza A by PCR NEGATIVE NEGATIVE Final   Influenza B by PCR NEGATIVE NEGATIVE Final    Comment: (NOTE) The Xpert Xpress SARS-CoV-2/FLU/RSV plus assay is intended as an aid in the diagnosis of influenza from Nasopharyngeal swab specimens and should not be used as a sole basis for treatment. Nasal washings and aspirates are unacceptable for Xpert Xpress SARS-CoV-2/FLU/RSV testing.  Fact Sheet for Patients: BloggerCourse.com  Fact Sheet for Healthcare Providers: SeriousBroker.it  This test is not yet approved or cleared by the [place] FDA and has been authorized for detection and/or diagnosis of SARS-CoV-2 by FDA under an Emergency Use Authorization (EUA). This EUA will remain in effect (meaning this test can be used) for the duration of the COVID-19 declaration under Section 564(b)(1) of the Act, 21 U.S.C. section 360bbb-3(b)(1), unless the authorization is terminated or revoked.  Performed at [facility], [address], [place], [place] [address]   Respiratory (~20 pathogens) panel by PCR     Status: None    Collection Time: 07/07/21  4:22 PM   Specimen: Nasopharyngeal Swab; Respiratory  Result Value Ref Range Status   Adenovirus NOT DETECTED NOT DETECTED Final   Coronavirus 229E NOT DETECTED NOT DETECTED Final    Comment: (NOTE) The Coronavirus on the Respiratory Panel, DOES NOT test for the novel  Coronavirus (2019 nCoV)    Coronavirus HKU1 NOT DETECTED NOT DETECTED Final   Coronavirus NL63 NOT DETECTED NOT DETECTED Final   Coronavirus OC43 NOT DETECTED NOT DETECTED Final   Metapneumovirus NOT DETECTED NOT DETECTED Final   Rhinovirus / Enterovirus NOT DETECTED NOT DETECTED Final   Influenza A NOT DETECTED NOT DETECTED Final   Influenza B NOT DETECTED NOT DETECTED Final   Parainfluenza Virus 1 NOT DETECTED NOT  DETECTED Final   Parainfluenza Virus 2 NOT DETECTED NOT DETECTED Final   Parainfluenza Virus 3 NOT DETECTED NOT DETECTED Final   Parainfluenza Virus 4 NOT DETECTED NOT DETECTED Final   Respiratory Syncytial Virus NOT DETECTED NOT DETECTED Final   Bordetella pertussis NOT DETECTED NOT DETECTED Final   Bordetella Parapertussis NOT DETECTED NOT DETECTED Final   Chlamydophila pneumoniae NOT DETECTED NOT DETECTED Final   Mycoplasma pneumoniae NOT DETECTED NOT DETECTED Final    Comment: Performed at [facility], [address], [place], [place] [address]    Studies/Results: CT CORONARY MORPH W/CTA COR W/SCORE W/CA W/CM &/OR WO/CM  Addendum Date: 07/06/2021   ADDENDUM REPORT: 07/06/2021 15:55 HISTORY: Chest pain, nonspecific Significant troponin elevation, fever, chest pain. EXAM: Cardiac/Coronary  CT TECHNIQUE: The patient was scanned on a [employer]. PROTOCOL: A 120 kV prospective scan was triggered in the descending thoracic aorta at 111 HU's. Axial non-contrast 3 mm slices were carried out through the heart. The data set was analyzed on a dedicated work station and scored using the Agatston method. Gantry rotation speed was 250 msecs and collimation was .6 mm. Beta  blockade and 0.8 mg of sl NTG was given. The 3D data set was reconstructed in 5% intervals of the 35-75 % of the R-R cycle. Systolic and diastolic phases were analyzed on a dedicated work station using MPR, MIP and VRT modes. The patient received 62mL OMNIPAQUE IOHEXOL 350 MG/ML SOLN contrast. FINDINGS: Image quality: Good Noise artifact is: Limited Coronary calcium score is 0. Coronary arteries: Normal left coronary origin. High takeoff of the RCA, above the right coronary sinus and ST junction. Right dominance. Right Coronary Artery: No detectable plaque or stenosis. Left Main Coronary Artery: No detectable plaque or stenosis. Left Anterior Descending Coronary Artery: No detectable plaque or stenosis. Left Circumflex Artery: No detectable plaque or stenosis. Aorta: Normal size, 26 mm at the mid ascending aorta (level of the PA bifurcation) measured double oblique. No calcifications. No dissection. Aortic Valve: No calcifications.  Tricuspid aortic valve. Other findings: Normal pulmonary vein drainage into the left atrium. Normal left atrial appendage without thrombus. Normal size of the pulmonary artery. IMPRESSION: 1. No evidence of CAD, CADRADS = 0. 2. Coronary calcium score of 0. 3. Normal L coronary origin. High takeoff of right coronary artery above right coronary cusp and ST junction. Right dominance. Electronically Signed   By: Weston Brass M.D.   On: 07/06/2021 15:55   Result Date: 07/06/2021 EXAM: OVER-READ INTERPRETATION  CT CHEST The following report is an over-read performed by radiologist Dr. Trudie Reed of [facility] on 07/06/2021. This over-read does not include interpretation of cardiac or coronary anatomy or pathology. The coronary calcium score/coronary CTA interpretation by the cardiologist is attached. COMPARISON:  None. FINDINGS: Dependent areas of subsegmental atelectasis are noted in the lower lobes of the lungs bilaterally. Within the visualized portions of the thorax  there are no suspicious appearing pulmonary nodules or masses, there is no acute consolidative airspace disease, no pleural effusions, no pneumothorax and no lymphadenopathy. Visualized portions of the upper abdomen are unremarkable. There are no aggressive appearing lytic or blastic lesions noted in the visualized portions of the skeleton. IMPRESSION: 1. No significant incidental noncardiac findings are noted. Electronically Signed: By: Trudie Reed M.D. On: 07/06/2021 14:50      Assessment/Plan:  INTERVAL HISTORY: Patient's symptoms continuing to improve he still has some pain in his legs when he   Active Problems:  NSTEMI (non-ST elevated myocardial infarction) (HCC)   Acute febrile illness   Rash   Acute viral myocarditis   Infective myositis of lower extremity   Viral exanthem   Transaminitis    Juan Bowers is a 40 y.o. male with admission for fevers with a rash chest pain and findings consistent with myocarditis as well as elevated transaminases.  #1  Febrile illness with hepatitis myocarditis and rash: I continue to believe this is likely a viral infection.  20 virus respiratory panel that was sent yesterday was negative.  His CPK level is negative.  The titers are negative EBV viral capsid IgM is negative IgG is past positive consistent with past infection.  Viral hepatitis panel HIV are all negative.  His syphilis titer is negative.  His coxsackievirus panel is Plante pending I ordered a Lyme titer as well though this is presentation is not consistent with Borrelia burgdorferi infection.    Juan Bowers has an appointment on 07/26/2021 at 930 AM with Dr. Daiva EvesVan Bowers  [facility] is [facility] at  [address] [place].  [address], which is located to the left of the elevators.  Phone: [phone]  Fax: [phone]  [email or web address]  He should arrive 15 to 30  minutes prior to his appointment.     LOS: 2 days   Juan Bowers 07/08/2021, 12:49 PM

## 2021-07-08 NOTE — Discharge Summary (Signed)
Discharge Summary    Patient ID: Juan Bowers MRN: [id number]; DOB: 19-Sep-1981  Admit date: 07/05/2021 Discharge date: 07/08/2021  PCP:  Default, Provider, MD   [facility] Providers Cardiologist:  Olga MillersBrian Crenshaw, MD    Discharge Diagnoses    Active Problems:   NSTEMI (non-ST elevated myocardial infarction) (HCC)   Acute febrile illness   Rash   Acute viral myocarditis   Infective myositis of lower extremity   Viral exanthem   Transaminitis   Diagnostic Studies/Procedures    Echo 07/06/21: 1. Left ventricular ejection fraction, by estimation, is 60 to 65%. The  left ventricle has normal function. The left ventricle has no regional  wall motion abnormalities. There is mild left ventricular hypertrophy.  Left ventricular diastolic parameters  were normal.   2. Right ventricular systolic function is normal. The right ventricular  size is normal. There is normal pulmonary artery systolic pressure. The  estimated right ventricular systolic pressure is 25.5 mmHg.   3. The mitral valve is normal in structure. Trivial mitral valve  regurgitation.   4. The aortic valve is tricuspid. Aortic valve regurgitation is trivial.  No aortic stenosis is present.   5. The inferior vena cava is normal in size with greater than 50%  respiratory variability, suggesting right atrial pressure of 3 mmHg.  _____________  CT coronary 07/06/21: IMPRESSION: 1. No evidence of CAD, CADRADS = 0.   2. Coronary calcium score of 0.   3. Normal L coronary origin. High takeoff of right coronary artery above right coronary cusp and ST junction. Right dominance.   History of Present Illness     Juan GeneraDarius E Kaufhold is a 40 y.o. male with no past medical history who is being seen 07/06/2021 for the evaluation of NSTEMI.  Mr. 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 Was in his normal state of health until Sunday when felt sore and stiff all over. Began developing fever to 102, dozens of papules on his  back, chest, legs, fatigue, sweats, chills. Yesterday began having intermittent non-exertional chest pain, substernal pressure-like, fairly mild. [facility] constitutional symptoms and was going to be discharged but developed worsening anginal symptoms, now radiating to the back and accompanied by left arm tingling. Resolved after ativan without need for ntg. Troponin, which at first was <2, went to 12k then to 16k. EKGs without dynamic changes but show inferior and anterior T wave inversions. Accept[facility]ology [facility] No Fhx cardiac disease. No drug use. LDL 150s last year but said he lost 20 pounds afterwards No travel, no tickbites  Hospital Course     Consultants: none  Myocarditis Cardiac CTA with no coronary disease and calcium score zero. Echocardiogram with normal LVEF. Presentation was consistent with myocarditis given febrile illness and significantly elevated troponin. COVID, CMP, hepatitis B, hepatitis C, and HIV negative. Pt was started on colchicine and metoprolol. Losartan added. ASA, statin, and heparin gtt discontinued. Will have a cardiac MRI after discharge.   Continue colchicine. Will possibly D/C colchicine in 4-6 week, and metoprolol and losartan in 6 months.    He was instructed that he cannot participate in competitive/vigorous activities for 6 months following his bout of myocarditis.   Febrile illness Etiology unclear. He had fever, myalgias, and rash. ID reviewed case and will follow up OP.    Pt seen and examined by Dr. Jens Somrenshaw. ID follow up has been scheduled. I have ordered a cardiac  MRI W/WO and have messaged our team to help me set this up.    Did the patient have an acute coronary syndrome (MI, NSTEMI, STEMI, etc) this admission?:  No.   The elevated Troponin was due to the acute medical illness (demand ischemia).      _____________  Discharge Vitals Blood pressure 115/73, pulse (!) 57,  temperature 98.2 F (36.8 C), temperature source Oral, resp. rate 18, height 6\' 1"  (1.854 m), weight 90.4 kg, SpO2 97 %.  Filed Weights   07/06/21 0441 07/07/21 0420 07/08/21 0357  Weight: 92.4 kg 90.9 kg 90.4 kg    Labs & Radiologic Studies    CBC Recent Labs    07/07/21 0313 07/08/21 0157  WBC 9.8 9.3  NEUTROABS 7.0  --   HGB 12.5* 13.1  HCT 35.4* 37.0*  MCV 88.5 88.9  PLT 238 277   Basic Metabolic Panel Recent Labs    09/07/21 0313 07/08/21 0157  NA 138 137  K 4.1 3.9  CL 101 102  CO2 27 28  GLUCOSE 114* 135*  BUN 6 <5*  CREATININE 1.01 0.96  CALCIUM 8.7* 8.7*  MG 2.3 2.4   Liver Function Tests Recent Labs    07/06/21 0530 07/07/21 0313  AST 76* 40  ALT 139* 102*  ALKPHOS 102 93  BILITOT 1.3* 1.0  PROT 7.0 6.5  ALBUMIN 3.4* 3.1*   No results for input(s): LIPASE, AMYLASE in the last 72 hours. High Sensitivity Troponin:   Recent Labs  Lab 07/06/21 0258 07/06/21 0756 07/06/21 1347 07/06/21 2109 07/07/21 0313  TROPONINIHS 16,108* 7,473* 5,905* 5,318* 3,725*    BNP Invalid input(s): POCBNP D-Dimer Recent Labs    07/06/21 0756  DDIMER 0.68*   Hemoglobin A1C Recent Labs    07/06/21 0530  HGBA1C 5.5   Fasting Lipid Panel Recent Labs    07/06/21 0530  CHOL 170  HDL 34*  LDLCALC 120*  TRIG 78  CHOLHDL 5.0   Thyroid Function Tests No results for input(s): TSH, T4TOTAL, T3FREE, THYROIDAB in the last 72 hours.  Invalid input(s): FREET3 _____________  DG Chest 2 View  Result Date: 07/05/2021 CLINICAL DATA:  Fever, chest tightness EXAM: CHEST - 2 VIEW COMPARISON:  None. FINDINGS: The heart size and mediastinal contours are within normal limits. Both lungs are clear. The visualized skeletal structures are unremarkable. IMPRESSION: No active cardiopulmonary disease. Electronically Signed   By: 09/04/2021 M.D.   On: 07/05/2021 20:21   CT CORONARY MORPH W/CTA COR W/SCORE W/CA W/CM &/OR WO/CM  Addendum Date: 07/06/2021   ADDENDUM REPORT:  07/06/2021 15:55 HISTORY: Chest pain, nonspecific Significant troponin elevation, fever, chest pain. EXAM: Cardiac/Coronary  CT TECHNIQUE: The patient was scanned on a 09/05/2021. PROTOCOL: A 120 kV prospective scan was triggered in the descending thoracic aorta at 111 HU's. Axial non-contrast 3 mm slices were carried out through the heart. The data set was analyzed on a dedicated work station and scored using the Agatston method. Gantry rotation speed was 250 msecs and collimation was .6 mm. Beta blockade and 0.8 mg of sl NTG was given. The 3D data set was reconstructed in 5% intervals of the 35-75 % of the R-R cycle. Systolic and diastolic phases were analyzed on a dedicated work station using MPR, MIP and VRT modes. The patient received 71mL OMNIPAQUE IOHEXOL 350 MG/ML SOLN contrast. FINDINGS: Image quality: Good Noise artifact is: Limited Coronary calcium score is 0. Coronary arteries: Normal left coronary origin. High takeoff of the RCA,  above the right coronary sinus and ST junction. Right dominance. Right Coronary Artery: No detectable plaque or stenosis. Left Main Coronary Artery: No detectable plaque or stenosis. Left Anterior Descending Coronary Artery: No detectable plaque or stenosis. Left Circumflex Artery: No detectable plaque or stenosis. Aorta: Normal size, 26 mm at the mid ascending aorta (level of the PA bifurcation) measured double oblique. No calcifications. No dissection. Aortic Valve: No calcifications.  Tricuspid aortic valve. Other findings: Normal pulmonary vein drainage into the left atrium. Normal left atrial appendage without thrombus. Normal size of the pulmonary artery. IMPRESSION: 1. No evidence of CAD, CADRADS = 0. 2. Coronary calcium score of 0. 3. Normal L coronary origin. High takeoff of right coronary artery above right coronary cusp and ST junction. Right dominance. Electronically Signed   By: Weston Brass M.D.   On: 07/06/2021 15:55   Result Date:  07/06/2021 EXAM: OVER-READ INTERPRETATION  CT CHEST The following report is an over-read performed by radiologist Dr. Trudie Reed of [facility] on 07/06/2021. This over-read does not include interpretation of cardiac or coronary anatomy or pathology. The coronary calcium score/coronary CTA interpretation by the cardiologist is attached. COMPARISON:  None. FINDINGS: Dependent areas of subsegmental atelectasis are noted in the lower lobes of the lungs bilaterally. Within the visualized portions of the thorax there are no suspicious appearing pulmonary nodules or masses, there is no acute consolidative airspace disease, no pleural effusions, no pneumothorax and no lymphadenopathy. Visualized portions of the upper abdomen are unremarkable. There are no aggressive appearing lytic or blastic lesions noted in the visualized portions of the skeleton. IMPRESSION: 1. No significant incidental noncardiac findings are noted. Electronically Signed: By: Trudie Reed M.D. On: 07/06/2021 14:50   ECHOCARDIOGRAM COMPLETE  Result Date: 07/06/2021    ECHOCARDIOGRAM REPORT   Patient Name:   OLAOLUWA GRIEDER Date of Exam: 07/06/2021 Medical Rec #:  [id number]     Height:       73.0 in Accession #:    [id number]    Weight:       203.7 lb Date of Birth:  [birth year]    BSA:          2.168 m Patient Age:    39 years      BP:           109/78 mmHg Patient Gender: M             HR:           77 bpm. Exam Location:  Inpatient Procedure: 2D Echo, Cardiac Doppler and Color Doppler Indications:    MI  History:        Patient has no prior history of Echocardiogram examinations.                 Acute MI.  Sonographer:    MH Referring Phys: [id number] [place] IMPRESSIONS  1. Left ventricular ejection fraction, by estimation, is 60 to 65%. The left ventricle has normal function. The left ventricle has no regional wall motion abnormalities. There is mild left ventricular hypertrophy. Left ventricular diastolic parameters were  normal.  2. Right ventricular systolic function is normal. The right ventricular size is normal. There is normal pulmonary artery systolic pressure. The estimated right ventricular systolic pressure is 25.5 mmHg.  3. The mitral valve is normal in structure. Trivial mitral valve regurgitation.  4. The aortic valve is tricuspid. Aortic valve regurgitation is trivial. No aortic stenosis is present.  5. The inferior vena cava is  normal in size with greater than 50% respiratory variability, suggesting right atrial pressure of 3 mmHg. FINDINGS  Left Ventricle: Left ventricular ejection fraction, by estimation, is 60 to 65%. The left ventricle has normal function. The left ventricle has no regional wall motion abnormalities. The left ventricular internal cavity size was normal in size. There is  mild left ventricular hypertrophy. Left ventricular diastolic parameters were normal. Right Ventricle: The right ventricular size is normal. No increase in right ventricular wall thickness. Right ventricular systolic function is normal. There is normal pulmonary artery systolic pressure. The tricuspid regurgitant velocity is 2.37 m/s, and  with an assumed right atrial pressure of 3 mmHg, the estimated right ventricular systolic pressure is 25.5 mmHg. Left Atrium: Left atrial size was normal in size. Right Atrium: Right atrial size was normal in size. Pericardium: There is no evidence of pericardial effusion. Mitral Valve: The mitral valve is normal in structure. Trivial mitral valve regurgitation. Tricuspid Valve: The tricuspid valve is normal in structure. Tricuspid valve regurgitation is trivial. Aortic Valve: The aortic valve is tricuspid. Aortic valve regurgitation is trivial. No aortic stenosis is present. Aortic valve mean gradient measures 3.0 mmHg. Aortic valve peak gradient measures 3.8 mmHg. Aortic valve area, by VTI measures 4.37 cm. Pulmonic Valve: The pulmonic valve was normal in structure. Pulmonic valve  regurgitation is trivial. Aorta: The aortic root and ascending aorta are structurally normal, with no evidence of dilitation. Venous: The inferior vena cava is normal in size with greater than 50% respiratory variability, suggesting right atrial pressure of 3 mmHg. IAS/Shunts: The interatrial septum was not well visualized.  LEFT VENTRICLE PLAX 2D LVIDd:         4.80 cm     Diastology LVIDs:         3.00 cm     LV e' medial:    8.70 cm/s LV PW:         1.50 cm     LV E/e' medial:  8.9 LV IVS:        1.30 cm     LV e' lateral:   9.90 cm/s LVOT diam:     2.20 cm     LV E/e' lateral: 7.8 LV SV:         90 LV SV Index:   42 LVOT Area:     3.80 cm  LV Volumes (MOD) LV vol d, MOD A4C: 54.1 ml LV vol s, MOD A4C: 17.0 ml LV SV MOD A4C:     54.1 ml RIGHT VENTRICLE             IVC RV S prime:     10.70 cm/s  IVC diam: 2.00 cm TAPSE (M-mode): 2.9 cm LEFT ATRIUM             Index       RIGHT ATRIUM           Index LA diam:        3.00 cm 1.38 cm/m  RA Area:     18.20 cm LA Vol (A2C):   20.2 ml 9.32 ml/m  RA Volume:   56.50 ml  26.06 ml/m LA Vol (A4C):   39.9 ml 18.40 ml/m LA Biplane Vol: 31.0 ml 14.30 ml/m  AORTIC VALVE                   PULMONIC VALVE AV Area (Vmax):    4.35 cm    PV Vmax:       0.74 m/s AV  Area (Vmean):   3.65 cm    PV Peak grad:  2.2 mmHg AV Area (VTI):     4.37 cm AV Vmax:           97.10 cm/s AV Vmean:          77.200 cm/s AV VTI:            0.206 m AV Peak Grad:      3.8 mmHg AV Mean Grad:      3.0 mmHg LVOT Vmax:         111.00 cm/s LVOT Vmean:        74.100 cm/s LVOT VTI:          0.237 m LVOT/AV VTI ratio: 1.15  AORTA Ao Root diam: 3.40 cm Ao Asc diam:  2.90 cm MITRAL VALVE               TRICUSPID VALVE MV Area (PHT): 4.89 cm    TR Peak grad:   22.5 mmHg MV E velocity: 77.60 cm/s  TR Vmax:        237.00 cm/s MV A velocity: 60.60 cm/s MV E/A ratio:  1.28        SHUNTS                            Systemic VTI:  0.24 m                            Systemic Diam: 2.20 cm Epifanio Lesches MD  Electronically signed by Epifanio Lesches MD Signature Date/Time: 07/06/2021/1:50:22 PM    Final    Disposition   Pt is being discharged home today in good condition.  Follow-up Plans & Appointments     Follow-up Information     [facility]   Specialty: Emergency Medicine Why: As needed, If symptoms worsen Contact information: [address] [place] [address] [phone]               Discharge Instructions     Diet - low sodium heart healthy   Complete by: As directed    Increase activity slowly   Complete by: As directed        Discharge Medications   Allergies as of 07/08/2021   No Known Allergies      Medication List     TAKE these medications    acetaminophen 500 MG tablet Commonly known as: TYLENOL Take 1,000 mg by mouth every 6 (six) hours as needed. For pain.   colchicine 0.6 MG tablet Take 1 tablet (0.6 mg total) by mouth daily. Start taking on: July 09, 2021   doxycycline 100 MG capsule Commonly known as: VIBRAMYCIN Take 1 capsule (100 mg total) by mouth 2 (two) times daily.   ibuprofen 200 MG tablet Commonly known as: ADVIL Take 400 mg by mouth every 4 (four) hours as needed for mild pain. For fever and pain.   losartan 25 MG tablet Commonly known as: COZAAR Take 1 tablet (25 mg total) by mouth daily. Start taking on: July 09, 2021   metoprolol tartrate 25 MG tablet Commonly known as: LOPRESSOR Take 0.5 tablets (12.5 mg total) by mouth 2 (two) times daily.   Multivitamin Gummies Adult Chew Chew 2 tablets by mouth daily.           Outstanding Labs/Studies   OP cardiac MRI  Duration of Discharge Encounter  Greater than 30 minutes including physician time.  Signed, Roe Rutherford Kristoph Sattler, PA 07/08/2021, 11:21 AM

## 2021-07-09 LAB — LYME DISEASE SEROLOGY W/REFLEX: Lyme Total Antibody EIA: NEGATIVE

## 2021-07-10 LAB — COXSACKIE B VIRUS ANTIBODIES
Coxsackie B1 Ab: 1:8 {titer} — ABNORMAL HIGH
Coxsackie B2 Ab: NEGATIVE
Coxsackie B3 Ab: NEGATIVE
Coxsackie B4 Ab: 1:8 {titer} — ABNORMAL HIGH
Coxsackie B5 Ab: 1:8 {titer} — ABNORMAL HIGH
Coxsackie B6 Ab: NEGATIVE

## 2021-07-10 LAB — COXSACKIE A VIRUS ANTIBODIES
Coxsackie A16 IgG: 1:800 {titer} — ABNORMAL HIGH
Coxsackie A16 IgM: NEGATIVE titer
Coxsackie A24 IgG: 1:800 {titer} — ABNORMAL HIGH
Coxsackie A24 IgM: NEGATIVE titer
Coxsackie A7 IgG: 1:100 {titer} — ABNORMAL HIGH
Coxsackie A7 IgM: NEGATIVE titer
Coxsackie A9 IgG: 1:800 {titer} — ABNORMAL HIGH
Coxsackie A9 IgM: NEGATIVE titer

## 2021-07-10 LAB — ROCKY MTN SPOTTED FVR ABS PNL(IGG+IGM)
RMSF IgG: NEGATIVE
RMSF IgM: 0.29 index (ref 0.00–0.89)

## 2021-07-26 ENCOUNTER — Ambulatory Visit: Payer: PRIVATE HEALTH INSURANCE | Admitting: Infectious Disease

## 2021-07-31 ENCOUNTER — Other Ambulatory Visit: Payer: Self-pay

## 2021-07-31 ENCOUNTER — Encounter: Payer: Self-pay | Admitting: Infectious Disease

## 2021-07-31 ENCOUNTER — Ambulatory Visit (INDEPENDENT_AMBULATORY_CARE_PROVIDER_SITE_OTHER): Payer: 59 | Admitting: Infectious Disease

## 2021-07-31 VITALS — BP 119/77 | HR 63 | Temp 98.5°F | Wt 203.6 lb

## 2021-07-31 DIAGNOSIS — B09 Unspecified viral infection characterized by skin and mucous membrane lesions: Secondary | ICD-10-CM | POA: Diagnosis not present

## 2021-07-31 DIAGNOSIS — R509 Fever, unspecified: Secondary | ICD-10-CM

## 2021-07-31 DIAGNOSIS — M60003 Infective myositis, unspecified right leg: Secondary | ICD-10-CM | POA: Diagnosis not present

## 2021-07-31 DIAGNOSIS — Z7185 Encounter for immunization safety counseling: Secondary | ICD-10-CM

## 2021-07-31 DIAGNOSIS — E782 Mixed hyperlipidemia: Secondary | ICD-10-CM

## 2021-07-31 DIAGNOSIS — R7401 Elevation of levels of liver transaminase levels: Secondary | ICD-10-CM

## 2021-07-31 DIAGNOSIS — I400 Infective myocarditis: Secondary | ICD-10-CM

## 2021-07-31 NOTE — Progress Notes (Signed)
lf  Subjective:  Chief complaint: He is anxious to build to play football again   Patient ID: Juan Bowers, male    DOB: 04/13/1981, 40 y.o.   MRN: [id number]  HPI  Jakyron is a 40 year old black man with a history of hypertension who was admitted to the hospital with what appears to be a viral infection characterized by myocarditis a viral exanthem, transaminitis and myalgias.  He was treated with colchicine had symptomatic improved.  We were consulted with regards to work-up for infectious causes of his myocarditis hepatitis and exanthem.  We are not able to pinpoint a specific cause.  His symptoms however have subsequently resolved.  He does still have some residual chest discomfort from time to time but otherwise is asymptomatic.       History reviewed. No pertinent past medical history.  No past surgical history on file.  History reviewed. No pertinent family history.    Social History   Socioeconomic History   Marital status: Single    Spouse name: Not on file   Number of children: Not on file   Years of education: Not on file   Highest education level: Not on file  Occupational History   Not on file  Tobacco Use   Smoking status: Never   Smokeless tobacco: Never  Substance and Sexual Activity   Alcohol use: Not Currently   Drug use: No   Sexual activity: Not on file  Other Topics Concern   Not on file  Social History Narrative   Not on file   Social Determinants of Health   Financial Resource Strain: Not on file  Food Insecurity: Not on file  Transportation Needs: Not on file  Physical Activity: Not on file  Stress: Not on file  Social Connections: Not on file    No Known Allergies   Current Outpatient Medications:    acetaminophen (TYLENOL) 500 MG tablet, Take 1,000 mg by mouth every 6 (six) hours as needed. For pain., Disp: , Rfl:    colchicine 0.6 MG tablet, Take 1 tablet (0.6 mg total) by mouth daily., Disp: 90 tablet, Rfl: 0   ibuprofen  (ADVIL,MOTRIN) 200 MG tablet, Take 400 mg by mouth every 4 (four) hours as needed for mild pain. For fever and pain., Disp: , Rfl:    losartan (COZAAR) 25 MG tablet, Take 1 tablet (25 mg total) by mouth daily., Disp: 90 tablet, Rfl: 2   metoprolol tartrate (LOPRESSOR) 25 MG tablet, Take 0.5 tablets (12.5 mg total) by mouth 2 (two) times daily., Disp: 180 tablet, Rfl: 2   Multiple Vitamins-Minerals (MULTIVITAMIN GUMMIES ADULT) CHEW, Chew 2 tablets by mouth daily. (Patient not taking: Reported on 07/31/2021), Disp: , Rfl:     Review of Systems  Constitutional:  Negative for activity change, appetite change, chills, diaphoresis, fatigue, fever and unexpected weight change.  HENT:  Negative for congestion, rhinorrhea, sinus pressure, sneezing, sore throat and trouble swallowing.   Eyes:  Negative for photophobia and visual disturbance.  Respiratory:  Negative for cough, chest tightness, shortness of breath, wheezing and stridor.   Cardiovascular:  Negative for chest pain, palpitations and leg swelling.  Gastrointestinal:  Negative for abdominal distention, abdominal pain, anal bleeding, blood in stool, constipation, diarrhea, nausea and vomiting.  Genitourinary:  Negative for difficulty urinating, dysuria, flank pain and hematuria.  Musculoskeletal:  Negative for arthralgias, back pain, gait problem, joint swelling and myalgias.  Skin:  Negative for color change, pallor, rash and wound.  Neurological:  Negative for  dizziness, tremors, weakness and light-headedness.  Hematological:  Negative for adenopathy. Does not bruise/bleed easily.  Psychiatric/Behavioral:  Negative for agitation, behavioral problems, confusion, decreased concentration, dysphoric mood and sleep disturbance.       Objective:   Physical Exam Constitutional:      Appearance: He is well-developed.  HENT:     Head: Normocephalic and atraumatic.  Eyes:     Conjunctiva/sclera: Conjunctivae normal.  Cardiovascular:     Rate and  Rhythm: Normal rate and regular rhythm.     Heart sounds: No murmur heard.   No friction rub. No gallop.  Pulmonary:     Effort: Pulmonary effort is normal. No respiratory distress.     Breath sounds: Normal breath sounds. No stridor. No wheezing or rhonchi.  Abdominal:     General: There is no distension.     Palpations: Abdomen is soft.  Musculoskeletal:        General: No tenderness. Normal range of motion.     Cervical back: Normal range of motion and neck supple.  Skin:    General: Skin is warm and dry.     Coloration: Skin is not pale.     Findings: No erythema or rash.  Neurological:     General: No focal deficit present.     Mental Status: He is alert and oriented to person, place, and time.  Psychiatric:        Mood and Affect: Mood normal.        Behavior: Behavior normal.        Thought Content: Thought content normal.        Judgment: Judgment normal.          Assessment & Plan:   Viral infection with myocarditis hepatitis exanthem and myalgias:  I have reviewed labs that were ordered as an inpatient to work-up his condition which included a negative RPR negative Lyme EIA (I never found that he had any evidence for this but I went ahead and did the test anyway so that I would be able to interpret it if he had a false positive) negative hepatitis AB and C serologies negative HIV test negative influenza a and B by PCR negative COVID-19 by PCR, negative HIV RNA negative serologies for [facility] spotted fever that were done) again no evidence that he had this) coxsackie antibodies were done and showed that he had prior infection with coxsackie a coxsackie B antibodies were borderline positive but not consistent with acute infection.  Going to repeat LFTs today given that they were still elevated when he was last seen in the hospital.  Also check a semiquantitative COVID antibody test since his last COVID infection that was known was in January 2022 and I doubt he would  still have significant antibodies but if his recent myocarditis was in fact due to COVID and the PCR was falsely negative this could be informative with regards to his recent myocarditis hepatitis and exanthem.  Vaccine counseling I would recommend that he get vaccinated against COVID-19 against influenza but he has had neither either.  Hypertension: BP well controlled on meds  Vitals:   07/31/21 0849  BP: 119/77  Pulse: 63  Temp: 98.5 F (36.9 C)   Hyperlipidemia: And his wife asked questions about whether he would need to be on a statin.  I asked him to follow-up with cardiology my understanding is that AHA cardiovascular risk factors as far as equations do not kick in until the age of 89

## 2021-08-01 ENCOUNTER — Telehealth: Payer: Self-pay

## 2021-08-01 LAB — HEPATIC FUNCTION PANEL
AG Ratio: 1.5 (calc) (ref 1.0–2.5)
ALT: 29 U/L (ref 9–46)
AST: 13 U/L (ref 10–40)
Albumin: 4.4 g/dL (ref 3.6–5.1)
Alkaline phosphatase (APISO): 60 U/L (ref 36–130)
Bilirubin, Direct: 0.1 mg/dL (ref 0.0–0.2)
Globulin: 2.9 g/dL (calc) (ref 1.9–3.7)
Indirect Bilirubin: 0.3 mg/dL (calc) (ref 0.2–1.2)
Total Bilirubin: 0.4 mg/dL (ref 0.2–1.2)
Total Protein: 7.3 g/dL (ref 6.1–8.1)

## 2021-08-01 LAB — SARS-COV-2 ANTIBODY(IGG)SPIKE,SEMI-QUANTITATIVE: SARS COV1 AB(IGG)SPIKE,SEMI QN: 2.97 index — ABNORMAL HIGH (ref <1.00)

## 2021-08-01 NOTE — Telephone Encounter (Signed)
Called patient to discuss results, no answer. Left HIPAA compliant voicemail letting him know a MyChart message will be sent and to please call with any further questions.   Sandie Ano, RN

## 2021-08-01 NOTE — Telephone Encounter (Signed)
Error

## 2021-08-01 NOTE — Telephone Encounter (Signed)
-----   Message from Randall Hiss, MD sent at 08/01/2021  4:01 PM EDT ----- Regarding: lfts normal COVID semi quant abs positive not sure that really means recent infection and his pcr was - but its possible  ----- Message ----- From: Janace Hoard Lab Results In Sent: 08/01/2021  12:47 AM EDT To: Randall Hiss, MD

## 2021-09-08 NOTE — Progress Notes (Signed)
Cardiology Office Note:    Date:  09/17/2021   ID:  Juan Bowers, DOB 18-Dec-1980, MRN [id number]  PCP:  Default, Provider, MD  Cardiologist:  Olga Millers, MD  Electrophysiologist:  None   Referring MD: No ref. provider found   Chief Complaint: hospital follow-up for myocarditis   History of Present Illness:    Juan Bowers is a 40 y.o. male with a history of recent NSTEMI secondary to Myocarditis who is followed by Dr. Jens Som and presents today for hospital follow-up for myocarditis.   Patient was recently admitted from 07/05/2021 to 07/08/2021 for NSTEMI after presenting with diffuse muscle aches, chills, fever, and papular rash on back/chest/legs followed by non-exertional progressive chest pressure. High-sensitivity troponin peaked at 16,108. Echo showed LVEF of 60-65% with normal wall motion, normal diastolic function, and no significant valvular disease. Coronary CTA showed coronary calcium score of 0 with no evidence of CAD. Presentation was consistent with myocarditis. COVID, CMP, Hepatitis B, Hepatitis C, and HIV were all negative. She was started on Colchicine, Lopressor, and Losartan. Plan was for outpatient cardiac MRI. Infections Disease was consulted in regards to his febrile illness and arranged outpatient follow-up.   Patient presents today for follow-up. Here with wife.  He has done well since discharge. He does report an occasional mild discomfort under his left arm.  He said it feels like a pressure if he is laying on that side at night. Does not sound cardiac in nature. He denies any chest pain no shortness of breath, orthopnea, PND, lower extremity edema.  No palpitations, lightheadedness, dizziness, syncope. He is avoiding any strenuous exercise as instructed but does go bowling.  He was wondering if it was his pre-workout supplement that could of caused his myocarditis. Discussed that it was felt to be a virus that was the cause given rash and fever. Unclear exactly  what virus has patient underwent thorough work-up by ID and this was unremarkable. He states rash has resolved. Of note, he was noted to have elevated LFTs during hospital in 06/2021 but these have normalized.  Past Medical History:  Diagnosis Date   History of non-ST elevation myocardial infarction (NSTEMI)    a. 06/2021 secondary myocarditis (coronary CTA calcium score of 0 with no evidence of CAD)   Myocarditis (HCC)     No past surgical history on file.  Current Medications: Current Meds  Medication Sig   acetaminophen (TYLENOL) 500 MG tablet Take 1,000 mg by mouth every 6 (six) hours as needed. For pain.   ibuprofen (ADVIL,MOTRIN) 200 MG tablet Take 400 mg by mouth every 4 (four) hours as needed for mild pain. For fever and pain.   losartan (COZAAR) 25 MG tablet Take 1 tablet (25 mg total) by mouth daily.   metoprolol tartrate (LOPRESSOR) 25 MG tablet Take 0.5 tablets (12.5 mg total) by mouth 2 (two) times daily.   [DISCONTINUED] colchicine 0.6 MG tablet Take 1 tablet (0.6 mg total) by mouth daily.   [DISCONTINUED] Multiple Vitamins-Minerals (MULTIVITAMIN GUMMIES ADULT) CHEW Chew 2 tablets by mouth daily.     Allergies:   Patient has no known allergies.   Social History   Socioeconomic History   Marital status: Single    Spouse name: Not on file   Number of children: Not on file   Years of education: Not on file   Highest education level: Not on file  Occupational History   Not on file  Tobacco Use   Smoking status: Never   Smokeless  tobacco: Never  Substance and Sexual Activity   Alcohol use: Not Currently   Drug use: No   Sexual activity: Not on file  Other Topics Concern   Not on file  Social History Narrative   Not on file   Social Determinants of Health   Financial Resource Strain: Not on file  Food Insecurity: Not on file  Transportation Needs: Not on file  Physical Activity: Not on file  Stress: Not on file  Social Connections: Not on file     Family  History: The patient's family history is not on file.  ROS:   Please see the history of present illness.     EKGs/Labs/Other Studies Reviewed:    The following studies were reviewed today:  Echocardiogram 07/06/2021: Impressions: 1. Left ventricular ejection fraction, by estimation, is 60 to 65%. The  left ventricle has normal function. The left ventricle has no regional  wall motion abnormalities. There is mild left ventricular hypertrophy.  Left ventricular diastolic parameters  were normal.   2. Right ventricular systolic function is normal. The right ventricular  size is normal. There is normal pulmonary artery systolic pressure. The  estimated right ventricular systolic pressure is 25.5 mmHg.   3. The mitral valve is normal in structure. Trivial mitral valve  regurgitation.   4. The aortic valve is tricuspid. Aortic valve regurgitation is trivial.  No aortic stenosis is present.   5. The inferior vena cava is normal in size with greater than 50%  respiratory variability, suggesting right atrial pressure of 3 mmHg.  _______________  Coronary CTA 07/06/2021: Impressions: 1. No evidence of CAD, CADRADS = 0. 2. Coronary calcium score of 0. 3. Normal L coronary origin. High takeoff of right coronary artery above right coronary cusp and ST junction. Right dominance.  EKG:  EKG ordered today. EKG personally reviewed and demonstrates normal sinus rhythm, rate 63 bpm, with no acute ST/T changes. Normal axis. Normal PR and QRS intervals. QTc 415 ms.  Recent Labs: 07/06/2021: B Natriuretic Peptide 128.1 07/08/2021: BUN <5; Creatinine, Ser 0.96; Hemoglobin 13.1; Magnesium 2.4; Platelets 277; Potassium 3.9; Sodium 137 07/31/2021: ALT 29  Recent Lipid Panel    Component Value Date/Time   CHOL 170 07/06/2021 0530   TRIG 78 07/06/2021 0530   HDL 34 (L) 07/06/2021 0530   CHOLHDL 5.0 07/06/2021 0530   VLDL 16 07/06/2021 0530   LDLCALC 120 (H) 07/06/2021 0530    Physical Exam:     Vital Signs: BP 116/64   Pulse 63   Ht 6\' 1"  (1.854 m)   Wt 207 lb 9.6 oz (94.2 kg)   SpO2 99%   BMI 27.39 kg/m     Wt Readings from Last 3 Encounters:  09/17/21 207 lb 9.6 oz (94.2 kg)  07/31/21 203 lb 9.6 oz (92.4 kg)  07/08/21 199 lb 6.4 oz (90.4 kg)     General: 40 y.o. African-American male in no acute distress. HEENT: Normocephalic and atraumatic. Sclera clear.  Neck: Supple. No carotid bruits. No JVD. Heart: RRR. Distinct S1 and S2. No murmurs, gallops, or rubs. Radial pulses 2+ and equal bilaterally. Lungs: No increased work of breathing. Clear to ausculation bilaterally. No wheezes, rhonchi, or rales.  Abdomen: Soft, non-distended, and non-tender to palpation.  MSK: Normal strength and tone for age.  Extremities: No lower extremity edema.    Skin: Warm and dry. Neuro: Alert and oriented x3. No focal deficits. Psych: Normal affect. Responds appropriately.  Assessment:    1. Acute viral myocarditis  Plan:    Viral Myocarditis - Patient recently admitted with acute viral myocarditis. High-sensitivity troponin peaked at 16,000. Coronary CTA showed coronary calcium score of 0 with no evidence of CAD. - Doing well. No chest pain.  - Can stop Colchicine now that he has completed >6 weeks of this.  - Continue Lopressor 12.5mg  twice daily and Losartan 25mg  daily. Will likely stop this in 4 more months per Dr. recommendation at discharge. - Cardiac MRI is planned for 11/15/2021.  - Discussed with patient again that he cannot participate in competitive/vigorous activities for 6 months following diagnosis of myocarditis.   Disposition: Patient already has follow-up with Dr. 11/17/2021 scheduled for March 2023.   Medication Adjustments/Labs and Tests Ordered: Current medicines are reviewed at length with the patient today.  Concerns regarding medicines are outlined above.  Orders Placed This Encounter  Procedures   EKG 12-Lead   No orders of the defined  types were placed in this encounter.   Patient Instructions  Medication Instructions:  STOP Colchicine  *If you need a refill on your cardiac medications before your next appointment, please call your pharmacy*  Lab Work: NONE ordered at this time of appointment   If you have labs (blood work) drawn today and your tests are completely normal, you will receive your results only by: MyChart Message (if you have MyChart) OR A paper copy in the mail If you have any lab test that is abnormal or we need to change your treatment, we will call you to review the results.  Testing/Procedures: NONE ordered at this time of appointment   Follow-Up: At [facility], you and your health needs are our priority.  As part of our continuing mission to provide you with exceptional heart care, we have created designated Provider Care Teams.  These Care Teams include your primary Cardiologist (physician) and Advanced Practice Providers (APPs -  Physician Assistants and Nurse Practitioners) who all work together to provide you with the care you need, when you need it.  Your next appointment:   As scheduled    The format for your next appointment:   In Person  Provider:   [facility], MD    Other Instructions    Signed, Olga Millers, PA-C  09/17/2021 11:38 AM    [facility]

## 2021-09-15 ENCOUNTER — Encounter: Payer: Self-pay | Admitting: Student

## 2021-09-17 ENCOUNTER — Ambulatory Visit: Payer: 59 | Admitting: Student

## 2021-09-17 ENCOUNTER — Encounter: Payer: Self-pay | Admitting: Student

## 2021-09-17 ENCOUNTER — Other Ambulatory Visit: Payer: Self-pay

## 2021-09-17 VITALS — BP 116/64 | HR 63 | Ht 73.0 in | Wt 207.6 lb

## 2021-09-17 DIAGNOSIS — I400 Infective myocarditis: Secondary | ICD-10-CM | POA: Diagnosis not present

## 2021-09-17 NOTE — Patient Instructions (Signed)
Medication Instructions:  STOP Colchicine  *If you need a refill on your cardiac medications before your next appointment, please call your pharmacy*  Lab Work: NONE ordered at this time of appointment   If you have labs (blood work) drawn today and your tests are completely normal, you will receive your results only by: MyChart Message (if you have MyChart) OR A paper copy in the mail If you have any lab test that is abnormal or we need to change your treatment, we will call you to review the results.  Testing/Procedures: NONE ordered at this time of appointment   Follow-Up: At [facility], you and your health needs are our priority.  As part of our continuing mission to provide you with exceptional heart care, we have created designated Provider Care Teams.  These Care Teams include your primary Cardiologist (physician) and Advanced Practice Providers (APPs -  Physician Assistants and Nurse Practitioners) who all work together to provide you with the care you need, when you need it.  Your next appointment:   As scheduled    The format for your next appointment:   In Person  Provider:   Olga Millers, MD    Other Instructions

## 2021-11-12 ENCOUNTER — Other Ambulatory Visit (HOSPITAL_COMMUNITY): Payer: Self-pay | Admitting: *Deleted

## 2021-11-12 ENCOUNTER — Telehealth (HOSPITAL_COMMUNITY): Payer: Self-pay | Admitting: *Deleted

## 2021-11-12 DIAGNOSIS — Z01812 Encounter for preprocedural laboratory examination: Secondary | ICD-10-CM

## 2021-11-12 NOTE — Telephone Encounter (Signed)
Attempted to call patient regarding upcoming cardiac MRI appointment and to ask if patient can obtain blood work prior to appointment. Left message on voicemail with name and callback number  Larey Brick RN Navigator Cardiac Imaging [facility] [phone] Office [phone] Cell

## 2021-11-15 ENCOUNTER — Other Ambulatory Visit: Payer: Self-pay

## 2021-11-15 ENCOUNTER — Ambulatory Visit (HOSPITAL_COMMUNITY)
Admission: RE | Admit: 2021-11-15 | Discharge: 2021-11-15 | Disposition: A | Discharge status: Home / Self Care | Payer: 59 | Source: Ambulatory Visit | Attending: Physician Assistant | Admitting: Physician Assistant

## 2021-11-15 DIAGNOSIS — R0789 Other chest pain: Secondary | ICD-10-CM

## 2021-11-15 MED ORDER — GADOBUTROL 1 MMOL/ML IV SOLN
10.0000 mL | Freq: Once | INTRAVENOUS | Status: AC | PRN
  Administered 2021-11-15: 10 mL via INTRAVENOUS

## 2021-12-25 NOTE — Progress Notes (Signed)
? ? ? ? ?  HPI: Follow-up myocarditis.  Patient admitted with probable viral syndrome September 2022.  Troponin increased to 16,108.  Echocardiogram showed normal LV function.  Coronary CTA showed coronary calcium score 0 and no coronary disease.  Presentation was felt likely myocarditis.  Patient was treated with colchicine, metoprolol and losartan.  Follow-up cardiac MRI January 2023 showed normal LV function with ejection fraction 63%, no evidence of acute myocarditis (however with history 4 months previously myocarditis may have resolved).  Since last seen the patient denies any dyspnea on exertion, orthopnea, PND, pedal edema, palpitations, syncope or chest pain. ? ? ?Current Outpatient Medications  ?Medication Sig Dispense Refill  ? losartan (COZAAR) 25 MG tablet Take 1 tablet (25 mg total) by mouth daily. 90 tablet 2  ? metoprolol tartrate (LOPRESSOR) 25 MG tablet Take 0.5 tablets (12.5 mg total) by mouth 2 (two) times daily. 180 tablet 2  ? acetaminophen (TYLENOL) 500 MG tablet Take 1,000 mg by mouth every 6 (six) hours as needed. For pain. (Patient not taking: Reported on 12/28/2021)    ? ibuprofen (ADVIL,MOTRIN) 200 MG tablet Take 400 mg by mouth every 4 (four) hours as needed for mild pain. For fever and pain. (Patient not taking: Reported on 12/28/2021)    ? ?No current facility-administered medications for this visit.  ? ? ? ?Past Medical History:  ?Diagnosis Date  ? History of non-ST elevation myocardial infarction (NSTEMI)   ? a. 06/2021 secondary myocarditis (coronary CTA calcium score of 0 with no evidence of CAD)  ? Myocarditis ([place])   ? ? ?History reviewed. No pertinent surgical history. ? ?Social History  ? ?Socioeconomic History  ? Marital status: Single  ?  Spouse name: Not on file  ? Number of children: Not on file  ? Years of education: Not on file  ? Highest education level: Not on file  ?Occupational History  ? Not on file  ?Tobacco Use  ? Smoking status: Never  ? Smokeless tobacco: Never   ?Substance and Sexual Activity  ? Alcohol use: Not Currently  ? Drug use: No  ? Sexual activity: Not on file  ?Other Topics Concern  ? Not on file  ?Social History Narrative  ? Not on file  ? ?Social Determinants of Health  ? ?Financial Resource Strain: Not on file  ?Food Insecurity: Not on file  ?Transportation Needs: Not on file  ?Physical Activity: Not on file  ?Stress: Not on file  ?Social Connections: Not on file  ?Intimate Partner Violence: Not on file  ? ? ?History reviewed. No pertinent family history. ? ?ROS: no fevers or chills, productive cough, hemoptysis, dysphasia, odynophagia, melena, hematochezia, dysuria, hematuria, rash, seizure activity, orthopnea, PND, pedal edema, claudication. Remaining systems are negative. ? ?Physical Exam: ?Well-developed well-nourished in no acute distress.  ?Skin is warm and dry.  ?HEENT is normal.  ?Neck is supple.  ?Chest is clear to auscultation with normal expansion.  ?Cardiovascular exam is regular rate and rhythm.  ?Abdominal exam nontender or distended. No masses palpated. ?Extremities show no edema. ?neuro grossly intact ? ? ?A/P ? ?1 previous myocarditis-resolved on cardiac MRI.  It has now been approximately 6 months since his previous event.  We will discontinue losartan and metoprolol.  He may resume all activities. ? ?Kirk Ruths, MD ? ? ? ?

## 2021-12-28 ENCOUNTER — Other Ambulatory Visit: Payer: Self-pay

## 2021-12-28 ENCOUNTER — Ambulatory Visit: Payer: 59 | Admitting: Cardiology

## 2021-12-28 ENCOUNTER — Encounter: Payer: Self-pay | Admitting: Cardiology

## 2021-12-28 VITALS — BP 114/62 | HR 72 | Ht 73.0 in | Wt 215.2 lb

## 2021-12-28 DIAGNOSIS — I400 Infective myocarditis: Secondary | ICD-10-CM | POA: Diagnosis not present

## 2021-12-28 NOTE — Patient Instructions (Addendum)
Medication Instructions:  ?STOP Losartan when you complete current supply ?STOP Metoprolol Tartrate when you complete current supply ? ?*If you need a refill on your cardiac medications before your next appointment, please call your pharmacy* ? ?Lab Work: ?NONE ordered at this time of appointment  ? ?If you have labs (blood work) drawn today and your tests are completely normal, you will receive your results only by: ?MyChart Message (if you have MyChart) OR ?A paper copy in the mail ?If you have any lab test that is abnormal or we need to change your treatment, we will call you to review the results. ? ?Testing/Procedures: ?NONE ordered at this time of appointment  ? ?Follow-Up: ?At [facility], you and your health needs are our priority.  As part of our continuing mission to provide you with exceptional heart care, we have created designated Provider Care Teams.  These Care Teams include your primary Cardiologist (physician) and Advanced Practice Providers (APPs -  Physician Assistants and Nurse Practitioners) who all work together to provide you with the care you need, when you need it. ? ?Your next appointment:   ?As Needed  ? ?The format for your next appointment:   ?In Person ? ?Provider:   ?Olga Millers, MD   ? ?Other Instructions ? ? ?

## 2023-01-24 IMAGING — DX DG CHEST 2V
2 series · 2 of 2 positions shown · non-contrast
Comparison: None.

CLINICAL DATA: Fever, chest tightness

EXAM:
CHEST - 2 VIEW

[chest pa]
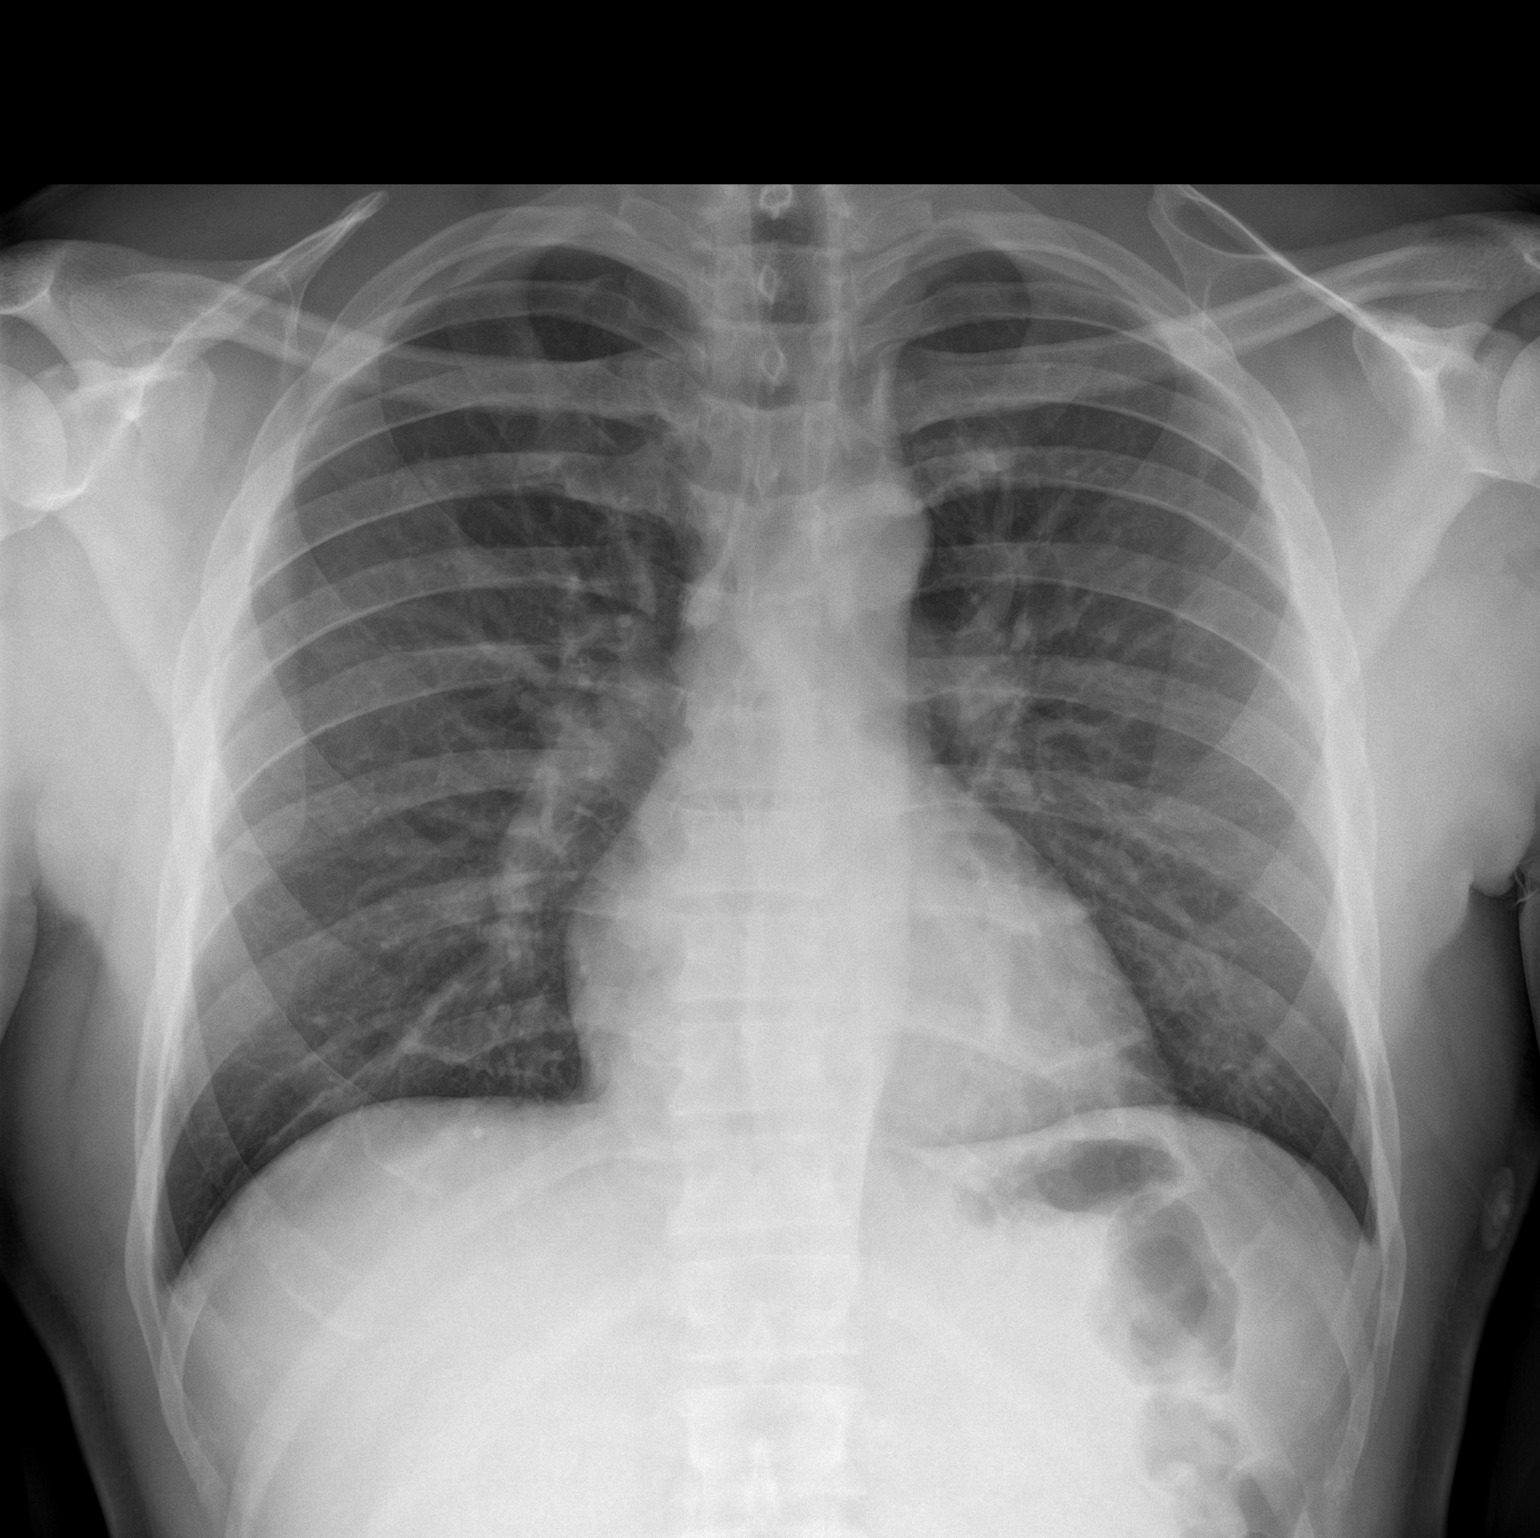

[chest lat]
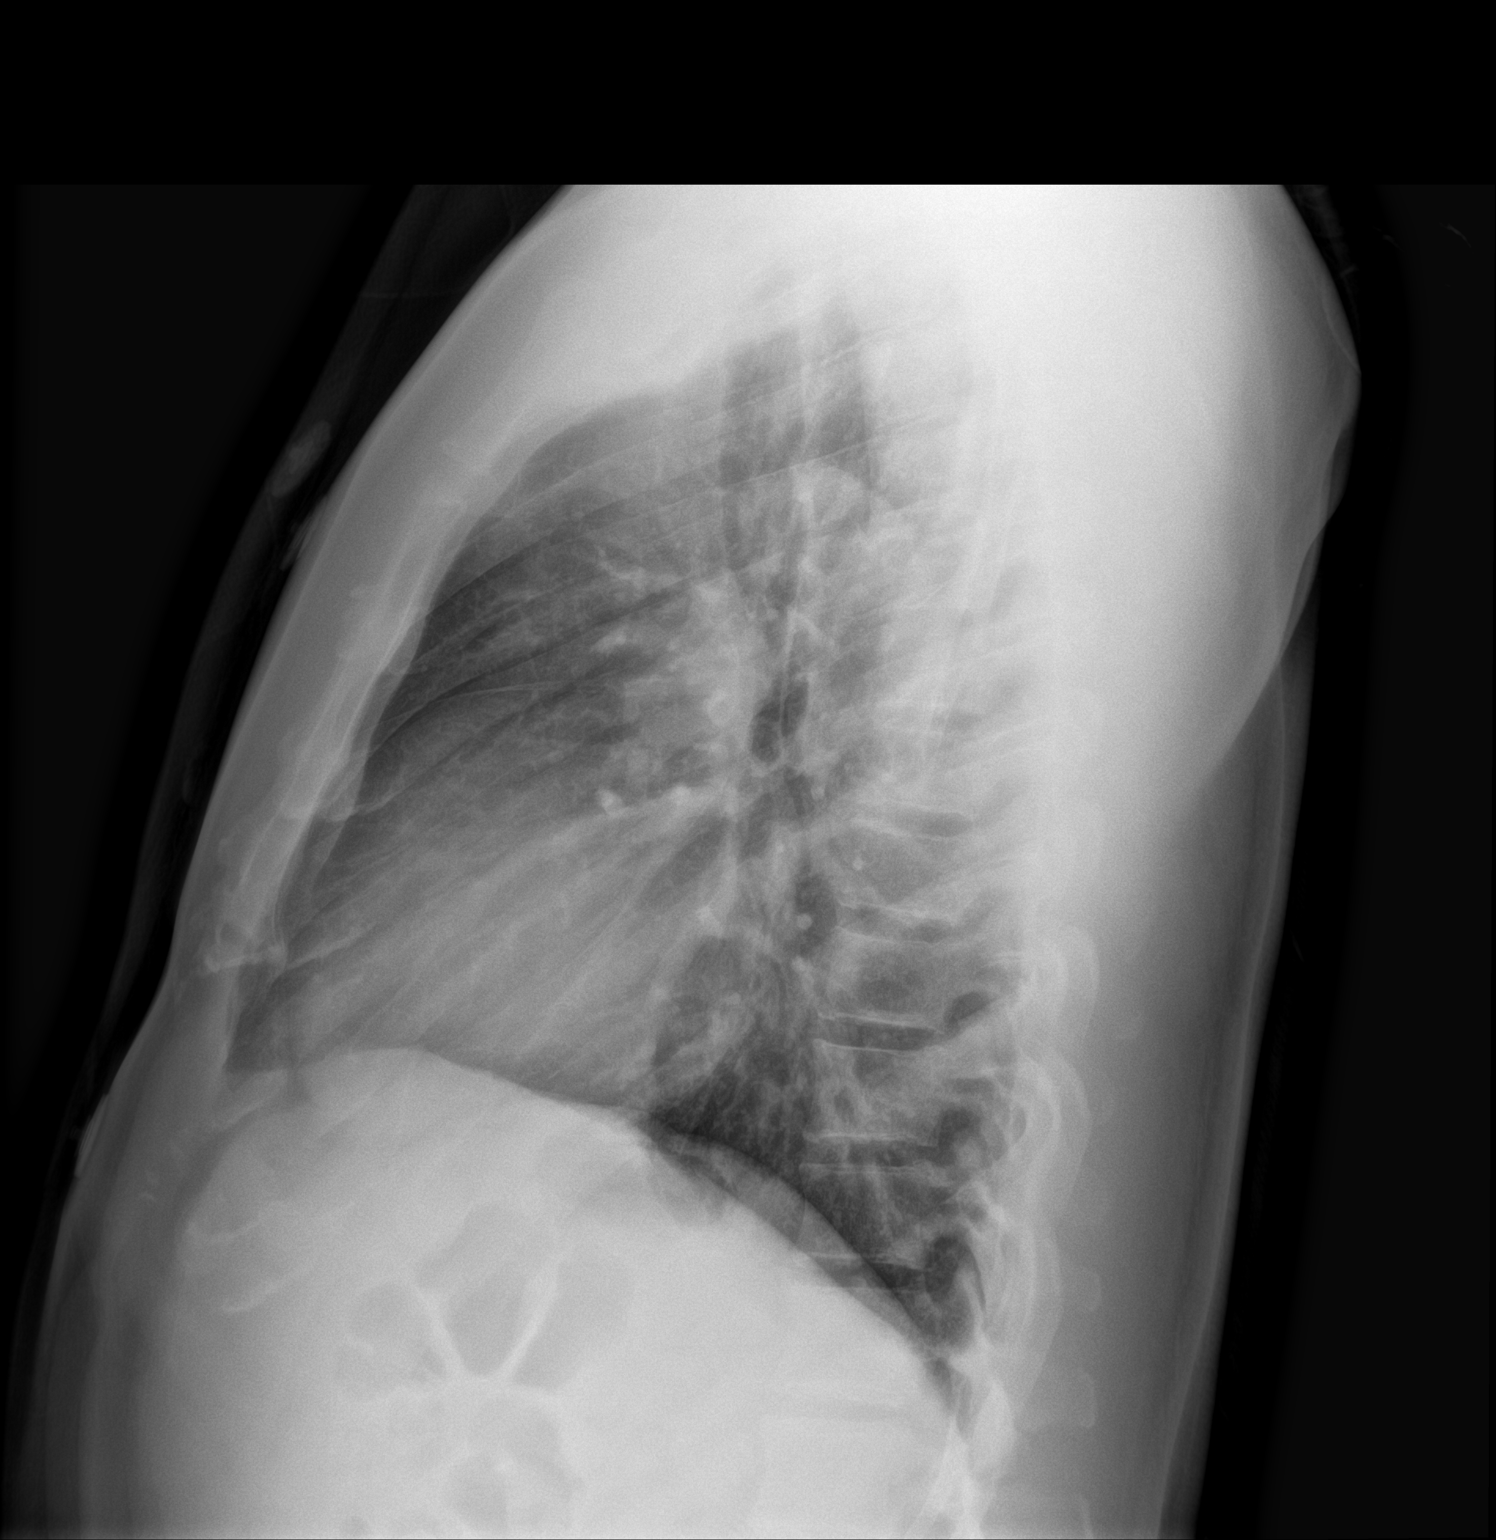

[2 of 2 positions shown; findings below may reference images not displayed]

FINDINGS: The heart size and mediastinal contours are within normal limits.
Both lungs are clear. The visualized skeletal structures are
unremarkable.
IMPRESSION: No active cardiopulmonary disease.
